# Patient Record
Sex: Female | Born: 2000 | Race: Black or African American | Hispanic: No | Marital: Single | Smoking: Never smoker
Health system: Southern US, Community
[De-identification: ages and names within clinical notes are randomized; demographics above are authoritative.]

## PROBLEM LIST (undated history)

## (undated) ENCOUNTER — Ambulatory Visit: Admission: EM | Source: Home / Self Care

## (undated) DIAGNOSIS — L509 Urticaria, unspecified: Secondary | ICD-10-CM

## (undated) DIAGNOSIS — T7840XA Allergy, unspecified, initial encounter: Secondary | ICD-10-CM

## (undated) DIAGNOSIS — IMO0002 Reserved for concepts with insufficient information to code with codable children: Secondary | ICD-10-CM

## (undated) DIAGNOSIS — D649 Anemia, unspecified: Secondary | ICD-10-CM

## (undated) DIAGNOSIS — L309 Dermatitis, unspecified: Secondary | ICD-10-CM

## (undated) DIAGNOSIS — T783XXA Angioneurotic edema, initial encounter: Secondary | ICD-10-CM

## (undated) DIAGNOSIS — B002 Herpesviral gingivostomatitis and pharyngotonsillitis: Secondary | ICD-10-CM

## (undated) DIAGNOSIS — R809 Proteinuria, unspecified: Secondary | ICD-10-CM

## (undated) DIAGNOSIS — S060X9A Concussion with loss of consciousness of unspecified duration, initial encounter: Secondary | ICD-10-CM

## (undated) HISTORY — DX: Concussion with loss of consciousness of unspecified duration, initial encounter: S06.0X9A

## (undated) HISTORY — DX: Herpesviral gingivostomatitis and pharyngotonsillitis: B00.2

## (undated) HISTORY — DX: Urticaria, unspecified: L50.9

## (undated) HISTORY — DX: Proteinuria, unspecified: R80.9

## (undated) HISTORY — DX: Allergy, unspecified, initial encounter: T78.40XA

## (undated) HISTORY — PX: NO PAST SURGERIES: SHX2092

## (undated) HISTORY — DX: Dermatitis, unspecified: L30.9

## (undated) HISTORY — DX: Angioneurotic edema, initial encounter: T78.3XXA

## (undated) HISTORY — DX: Anemia, unspecified: D64.9

## (undated) HISTORY — DX: Reserved for concepts with insufficient information to code with codable children: IMO0002

---
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
## 2019-01-08 ENCOUNTER — Ambulatory Visit: Payer: Medicaid Other | Admitting: Allergy and Immunology

## 2019-01-11 ENCOUNTER — Ambulatory Visit: Payer: Medicaid Other | Admitting: Family

## 2019-01-12 ENCOUNTER — Encounter: Payer: Self-pay | Admitting: Family Medicine

## 2019-01-12 ENCOUNTER — Ambulatory Visit (INDEPENDENT_AMBULATORY_CARE_PROVIDER_SITE_OTHER): Payer: Medicaid Other | Admitting: Family Medicine

## 2019-01-12 ENCOUNTER — Other Ambulatory Visit: Payer: Self-pay

## 2019-01-12 VITALS — BP 120/60 | HR 81 | Temp 98.0°F | Resp 16 | Ht 66.0 in | Wt 129.6 lb

## 2019-01-12 DIAGNOSIS — H1013 Acute atopic conjunctivitis, bilateral: Secondary | ICD-10-CM | POA: Diagnosis not present

## 2019-01-12 DIAGNOSIS — J3089 Other allergic rhinitis: Secondary | ICD-10-CM

## 2019-01-12 DIAGNOSIS — Z91018 Allergy to other foods: Secondary | ICD-10-CM | POA: Diagnosis not present

## 2019-01-12 NOTE — Progress Notes (Signed)
[address] [place] [address] Dept: [phone]  FOLLOW UP NOTE  Patient ID: Natasha Douglas, female    DOB: 2001-02-24  Age: 18 y.o. MRN: [id number] Date of Office Visit: 01/12/2019  Assessment  Chief Complaint: Food Intolerance (ate some shrimp, eyes started itching about 30 minutes later, woke up and eyes were swollen shut, ate purple and green grapes and lips swelled up) and Allergic Rhinitis  (has never happened to her with her dogs)  HPI Natasha Douglas is a 18 year old female who presents to the clinic for a follow up of an allergic reaction. She was last seen in this clinic on 07/20/2016 by Dr. Verlin Fester for evaluation of allergic reaction, allergic rhinitis, allergic conjunctivitis, and atopic dermatitis. At today's visit, she reports suspected allergic reactions occurring after eating shrimp and on a separate occasion eating grapes. She reports eye swelling that began after eating shrimp for which she took Benadryl with no relief of swelling. The swelling resolved in 2 days. Another incident occurred with blisters and burning of her lips immediately after eating grapes for which she used her triamcinolone cream with resolution if 2 days. She denies shortness of breath, however, she reports nausea during these events. She is currently avoiding seafood and grapes and has access to an epinephrine device at all times. Allergic rhinitis is reported as well controlled with Xyzal and Flonase as needed. Atopic dermatitis is well controlled with a daily moisturizer and triamcinolone cream as needed. Her current medications are listed in the chart.    Drug Allergies:  Allergies  Allergen Reactions  . Pollen Extract     Sneezing itchy eyes and runny nose  . Shrimp [Shellfish Allergy]     Facial swelling    Physical Exam: BP 120/60 (BP Location: Left Arm, Patient Position: Sitting, Cuff Size: Normal)   Pulse 81   Temp 98 F (36.7 C) (Temporal)   Resp 16   Ht 5\' 6"   (1.676 m)   Wt 129 lb 9.6 oz (58.8 kg)   SpO2 99%   BMI 20.92 kg/m    Physical Exam Vitals signs reviewed.  Constitutional:      Appearance: Normal appearance.  HENT:     Head: Normocephalic and atraumatic.     Right Ear: Tympanic membrane normal.     Left Ear: Tympanic membrane normal.     Nose:     Comments: Bilateral nares normal. Pharynx normal. Ears normal. Eyes normal    Mouth/Throat:     Pharynx: Oropharynx is clear.  Eyes:     Conjunctiva/sclera: Conjunctivae normal.  Neck:     Musculoskeletal: Normal range of motion and neck supple.  Cardiovascular:     Rate and Rhythm: Normal rate and regular rhythm.     Heart sounds: Normal heart sounds. No murmur.  Pulmonary:     Effort: Pulmonary effort is normal.     Breath sounds: Normal breath sounds.     Comments: Lungs clear to auscultation Musculoskeletal: Normal range of motion.  Skin:    General: Skin is warm and dry.  Neurological:     Mental Status: She is alert and oriented to person, place, and time.  Psychiatric:        Mood and Affect: Mood normal.        Behavior: Behavior normal.        Thought Content: Thought content normal.        Judgment: Judgment normal.     Diagnostics: Environmental skin testing  positive to grass pollen, tree pollen, and dust mites. Intradermal skint esting negative.  Selected food skin testing was negative.   Assessment and Plan: 1. Food allergy   2. Perennial and seasonal allergic rhinitis   3. Allergic conjunctivitis of both eyes     Patient Instructions  Food allergy Your skin testing to food was negative for all foods tested.  Continue to avoid shellfish, fish, grapes, and pineapple. In case of an allergic reaction, take Benadryl 50 mg every 4 hours, and if life-threatening symptoms occur, inject with EpiPen 0.3 mg. If your symptoms re-occur, begin a journal of events that occurred for up to 6 hours before your symptoms began including foods and beverages consumed,  soaps or perfumes you had contact with, and medications.   Allergic rhinitis Your environmental testing was positive to grass pollen, tree pollen, and dust mites. Avoidance measures provided. Continue with Claritin 10 mg once a day as needed for a runny nose Continue fluticasone nasal spray 2 sprays in each nostril once a day as needed for a stuffy nose Consider saline nasal rinses as needed for nasal symptoms. Use this before any medicated nasal sprays for best result  Atopic dermatitis Continue triamcinolone 0.1% cream twice a day as needed to red, itchy areas below your face. Continue a daily moisturizing routine  Call the clinic if this treatment plan is not working well for you  Follow up in 6 months or sooner if needed.  Return in about 6 months (around 07/12/2019), or if symptoms worsen or fail to improve.    Thank you for the opportunity to care for this patient.  Please do not hesitate to contact me with questions.  Thermon Leyland, FNP [facility]

## 2019-01-12 NOTE — Patient Instructions (Addendum)
Food allergy Your skin testing to food was negative for all foods tested.  Continue to avoid shellfish, fish, grapes, and pineapple. In case of an allergic reaction, take Benadryl 50 mg every 4 hours, and if life-threatening symptoms occur, inject with EpiPen 0.3 mg. If your symptoms re-occur, begin a journal of events that occurred for up to 6 hours before your symptoms began including foods and beverages consumed, soaps or perfumes you had contact with, and medications.   Allergic rhinitis Your environmental testing was positive to grass pollen, tree pollen, and dust mites. Avoidance measures provided. Continue with Claritin 10 mg once a day as needed for a runny nose Continue fluticasone nasal spray 2 sprays in each nostril once a day as needed for a stuffy nose Consider saline nasal rinses as needed for nasal symptoms. Use this before any medicated nasal sprays for best result  Atopic dermatitis Continue triamcinolone 0.1% cream twice a day as needed to red, itchy areas below your face. Continue a daily moisturizing routine  Call the clinic if this treatment plan is not working well for you  Follow up in 6 months or sooner if needed.  Reducing Pollen Exposure The American Academy of Allergy, Asthma and Immunology suggests the following steps to reduce your exposure to pollen during allergy seasons. 1. Do not hang sheets or clothing out to dry; pollen may collect on these items. 2. Do not mow lawns or spend time around freshly cut grass; mowing stirs up pollen. 3. Keep windows closed at night.  Keep car windows closed while driving. 4. Minimize morning activities outdoors, a time when pollen counts are usually at their highest. 5. Stay indoors as much as possible when pollen counts or humidity is high and on windy days when pollen tends to remain in the air longer. 6. Use air conditioning when possible.  Many air conditioners have filters that trap the pollen spores. 7. Use a HEPA room  air filter to remove pollen form the indoor air you breathe.  Control of House Dust Mite Allergen House dust mites play a major role in allergic asthma and rhinitis.  They occur in environments with high humidity wherever human skin, the food for dust mites is found. High levels have been detected in dust obtained from mattresses, pillows, carpets, upholstered furniture, bed covers, clothes and soft toys.  The principal allergen of the house dust mite is found in its feces.  A gram of dust may contain 1,000 mites and 250,000 fecal particles.  Mite antigen is easily measured in the air during house cleaning activities.    1. Encase mattresses, including the box spring, and pillow, in an air tight cover.  Seal the zipper end of the encased mattresses with wide adhesive tape. 2. Wash the bedding in water of 130 degrees Farenheit weekly.  Avoid cotton comforters/quilts and flannel bedding: the most ideal bed covering is the dacron comforter. 3. Remove all upholstered furniture from the bedroom. 4. Remove carpets, carpet padding, rugs, and non-washable window drapes from the bedroom.  Wash drapes weekly or use plastic window coverings. 5. Remove all non-washable stuffed toys from the bedroom.  Wash stuffed toys weekly. 6. Have the room cleaned frequently with a vacuum cleaner and a damp dust-mop.  The patient should not be in a room which is being cleaned and should wait 1 hour after cleaning before going into the room. 7. Close and seal all heating outlets in the bedroom.  Otherwise, the room will become filled with dust-laden  air.  An electric heater can be used to heat the room. 8. Reduce indoor humidity to less than 50%.  Do not use a humidifier.

## 2019-01-15 ENCOUNTER — Ambulatory Visit: Payer: Medicaid Other | Admitting: Pediatrics

## 2019-01-15 LAB — ALLERGEN PROFILE, FOOD-FISH
Allergen Mackerel IgE: <0.1 kU/L
Allergen Salmon IgE: <0.1 kU/L
Allergen Trout IgE: <0.1 kU/L
Allergen Walley Pike IgE: <0.1 kU/L
Codfish IgE: <0.1 kU/L
Halibut IgE: <0.1 kU/L
Tuna: <0.1 kU/L

## 2019-01-15 LAB — SACCHAROMYCES CEREVISIAE ANTIBODIES, IGG AND IGA
Saccharomyces cerevisiae, IgA: <20 Units (ref 0.0–24.9)
Saccharomyces cerevisiae, IgG: 59.7 Units — ABNORMAL HIGH (ref 0.0–24.9)

## 2019-01-15 LAB — F297-IGE ACACIA GUM: F297-IgE Acacia Gum: <0.1 kU/L

## 2019-01-15 LAB — ALLERGEN PROFILE, SHELLFISH
Clam IgE: <0.1 kU/L
F023-IgE Crab: <0.1 kU/L
F080-IgE Lobster: <0.1 kU/L
F290-IgE Oyster: <0.1 kU/L
Scallop IgE: <0.1 kU/L
Shrimp IgE: 0.11 kU/L — AB

## 2019-01-15 LAB — ALLERGEN GRAPE F259: Allergen Grape IgE: <0.1 kU/L

## 2019-01-15 LAB — ALLERGEN, PINEAPPLE, F210: Pineapple IgE: 0.33 kU/L — AB

## 2019-01-16 NOTE — Progress Notes (Signed)
Can you please let this patient know that her blood work was negative to grape and fish. She had slightly elevated level to shrimp and pineapple. At this time she should continue to avoid these foods. She con come into the clinic for a food challenge to these foods. Please have her keep a detailed journal of foods, medications, and products she has had beginning 6 hours before the reaction starts. Please offer patch testing also. Thank you

## 2019-01-29 ENCOUNTER — Encounter: Payer: Self-pay | Admitting: Family

## 2019-03-20 ENCOUNTER — Ambulatory Visit (INDEPENDENT_AMBULATORY_CARE_PROVIDER_SITE_OTHER): Payer: Medicaid Other | Admitting: Podiatry

## 2019-03-20 ENCOUNTER — Other Ambulatory Visit: Payer: Self-pay

## 2019-03-20 ENCOUNTER — Encounter: Payer: Self-pay | Admitting: Podiatry

## 2019-03-20 DIAGNOSIS — Z20828 Contact with and (suspected) exposure to other viral communicable diseases: Secondary | ICD-10-CM | POA: Diagnosis not present

## 2019-03-20 DIAGNOSIS — L600 Ingrowing nail: Secondary | ICD-10-CM | POA: Diagnosis not present

## 2019-03-20 DIAGNOSIS — G8929 Other chronic pain: Secondary | ICD-10-CM

## 2019-03-20 NOTE — Patient Instructions (Signed)

## 2019-03-21 ENCOUNTER — Encounter: Payer: Self-pay | Admitting: *Deleted

## 2019-03-21 ENCOUNTER — Telehealth: Payer: Self-pay | Admitting: Podiatry

## 2019-03-21 NOTE — Telephone Encounter (Signed)
Pt called to see if she could have a DR.note for work sent to her my chart or fax to her job at [phone] attn : evette nazario

## 2019-03-21 NOTE — Telephone Encounter (Signed)
Faxed out of work note to [employer].

## 2019-03-21 NOTE — Telephone Encounter (Signed)
Emailed out of work letter to pt.

## 2019-03-27 ENCOUNTER — Encounter: Payer: Self-pay | Admitting: Podiatry

## 2019-03-27 DIAGNOSIS — L600 Ingrowing nail: Secondary | ICD-10-CM | POA: Insufficient documentation

## 2019-03-27 NOTE — Progress Notes (Signed)
Subjective:   Patient ID: Nestor Ramp, female   DOB: 18 y.o.   MRN: [id number]   HPI 18 year old female presents the office today for concerns of ingrown toenails to both of her big toes with the left side worse than the right.  She states that both corners become tender in the left side however the lateral is more symptomatic.  She has noticed some swelling to the area.  Denies any significant drainage or pus or any redness or red streaks.  This been ongoing for last 1 year.  She has tried to trim the nails herself without any significant improvement.  She has no other concerns.   Review of Systems  All other systems reviewed and are negative.  Past Medical History:  Diagnosis Date  . Allergy   . Anemia    abstracted from outside medical records.   . Angio-edema   . Concussion 2012  . Displacement of intervertebral disc, site unspecified, without myelopathy    age 59 - abstracted from oustide medical record.    . Eczema   . Herpes gingivostomatitis    age 59 (abstracted from outside medical records)  . Proteinuria 2012   abstracted from outside medical record - random urine protein 19 mg/dL / Random cr 086.5  = normal PC ratio  . Urticaria     Past Surgical History:  Procedure Laterality Date  . NO PAST SURGERIES       Current Outpatient Medications:  .  EPINEPHrine 0.3 mg/0.3 mL IJ SOAJ injection, Inject 0.3 mLs (0.3 mg total) into the muscle as needed for anaphylaxis., Disp: 1 each, Rfl: 0 .  BENZACLIN gel, Apply topically every morning. (Patient not taking: Reported on 03/20/2019), Disp: 25 g, Rfl: 11 .  clindamycin-benzoyl peroxide (BENZACLIN) gel, Apply topically 2 (two) times daily. (Patient not taking: Reported on 03/20/2019), Disp: 25 g, Rfl: 0 .  famotidine (PEPCID) 20 MG tablet, Take 1 tablet (20 mg total) by mouth 2 (two) times daily. (Patient not taking: Reported on 03/20/2019), Disp: 30 tablet, Rfl: 0 .  loratadine (CLARITIN) 10 MG tablet, Take 1 tablet (10 mg  total) by mouth daily. (Patient not taking: Reported on 03/20/2019), Disp: 30 tablet, Rfl: 0  Allergies  Allergen Reactions  . Pollen Extract     Sneezing itchy eyes and runny nose  . Shrimp [Shellfish Allergy]     Facial swelling         Objective:  Physical Exam  General: AAO x3, NAD  Dermatological: Incurvation present to both the lateral > medial aspect the left hallux toenail with tenderness palpation there is localized edema but there is no significant erythema there is no ascending cellulitis.  There is no fluctuation crepitation.  There is no malodor.  Right side with no significant pain today.  Vascular: Dorsalis Pedis artery and Posterior Tibial artery pedal pulses are 2/4 bilateral with immedate capillary fill time. There is no pain with calf compression, swelling, warmth, erythema.   Neruologic: Grossly intact via light touch bilateral.Protective threshold with Semmes Wienstein monofilament intact to all pedal sites bilateral.   Musculoskeletal: No gross boney pedal deformities bilateral. No pain, crepitus, or limitation noted with foot and ankle range of motion bilateral. Muscular strength 5/5 in all groups tested bilateral.  Gait: Unassisted, Nonantalgic.       Assessment:   18 year old female with left symptomatic ingrown toenail lateral, medial nail borders     Plan:  -Treatment options discussed including all alternatives, risks, and complications -Etiology of  symptoms were discussed -At this time, the patient is requesting partial nail removal with chemical matricectomy to the symptomatic portion of the nail. Risks and complications were discussed with the patient for which they understand and written consent was obtained. Under sterile conditions a total of 3 mL of a mixture of 2% lidocaine plain and 0.5% Marcaine plain was infiltrated in a hallux block fashion. Once anesthetized, the skin was prepped in sterile fashion. A tourniquet was then applied. Next the  medial and lateral aspect of hallux nail border was then sharply excised making sure to remove the entire offending nail border. Once the nails were ensured to be removed area was debrided and the underlying skin was intact. There is no purulence identified in the procedure. Next phenol was then applied under standard conditions and copiously irrigated. Silvadene was applied. A dry sterile dressing was applied. After application of the dressing the tourniquet was removed and there is found to be an immediate capillary refill time to the digit. The patient tolerated the procedure well any complications. Post procedure instructions were discussed the patient for which he verbally understood. Follow-up in one week for nail check or sooner if any problems are to arise. Discussed signs/symptoms of infection and directed to call the office immediately should any occur or go directly to the emergency room. In the meantime, encouraged to call the office with any questions, concerns, changes symptoms.  Return in about 2 weeks (around 04/03/2019).  Trula Slade DPM

## 2019-04-03 ENCOUNTER — Ambulatory Visit: Payer: Medicaid Other | Admitting: Podiatry

## 2019-04-17 DIAGNOSIS — Z20828 Contact with and (suspected) exposure to other viral communicable diseases: Secondary | ICD-10-CM | POA: Diagnosis not present

## 2019-05-10 ENCOUNTER — Other Ambulatory Visit: Payer: Self-pay | Admitting: Family

## 2019-05-10 MED ORDER — BENZACLIN 1-5 % EX GEL: CUTANEOUS | 11 refills | Status: AC

## 2019-05-14 ENCOUNTER — Other Ambulatory Visit: Payer: Self-pay | Admitting: Pediatrics

## 2019-05-14 ENCOUNTER — Telehealth: Payer: Self-pay

## 2019-05-14 MED ORDER — TRETINOIN 0.025 % EX CREA: TOPICAL_CREAM | Freq: Every day | CUTANEOUS | 0 refills | Status: AC

## 2019-05-14 NOTE — Telephone Encounter (Signed)
Received notification from pharmacy- benzaclin gel no longer covered. Retin-A cream or gel preferred as of 04/20/19.

## 2019-05-14 NOTE — Telephone Encounter (Signed)
Sent retin-a cream

## 2019-05-26 DIAGNOSIS — H40033 Anatomical narrow angle, bilateral: Secondary | ICD-10-CM | POA: Diagnosis not present

## 2019-05-26 DIAGNOSIS — H1013 Acute atopic conjunctivitis, bilateral: Secondary | ICD-10-CM | POA: Diagnosis not present

## 2019-05-31 DIAGNOSIS — N7689 Other specified inflammation of vagina and vulva: Secondary | ICD-10-CM | POA: Diagnosis not present

## 2019-06-03 DIAGNOSIS — H5213 Myopia, bilateral: Secondary | ICD-10-CM | POA: Diagnosis not present

## 2019-07-10 DIAGNOSIS — Z20828 Contact with and (suspected) exposure to other viral communicable diseases: Secondary | ICD-10-CM | POA: Diagnosis not present

## 2019-08-09 DIAGNOSIS — J302 Other seasonal allergic rhinitis: Secondary | ICD-10-CM | POA: Diagnosis not present

## 2019-08-09 DIAGNOSIS — Z1329 Encounter for screening for other suspected endocrine disorder: Secondary | ICD-10-CM | POA: Diagnosis not present

## 2019-08-09 DIAGNOSIS — Z13220 Encounter for screening for lipoid disorders: Secondary | ICD-10-CM | POA: Diagnosis not present

## 2019-08-09 DIAGNOSIS — Z131 Encounter for screening for diabetes mellitus: Secondary | ICD-10-CM | POA: Diagnosis not present

## 2019-08-09 DIAGNOSIS — Z113 Encounter for screening for infections with a predominantly sexual mode of transmission: Secondary | ICD-10-CM | POA: Diagnosis not present

## 2019-08-13 ENCOUNTER — Encounter: Payer: Self-pay | Admitting: *Deleted

## 2019-08-17 DIAGNOSIS — E782 Mixed hyperlipidemia: Secondary | ICD-10-CM | POA: Diagnosis not present

## 2019-08-17 DIAGNOSIS — J302 Other seasonal allergic rhinitis: Secondary | ICD-10-CM | POA: Diagnosis not present

## 2019-08-17 DIAGNOSIS — Z3009 Encounter for other general counseling and advice on contraception: Secondary | ICD-10-CM | POA: Diagnosis not present

## 2019-08-17 DIAGNOSIS — A749 Chlamydial infection, unspecified: Secondary | ICD-10-CM | POA: Diagnosis not present

## 2019-08-17 DIAGNOSIS — D72819 Decreased white blood cell count, unspecified: Secondary | ICD-10-CM | POA: Diagnosis not present

## 2019-08-17 DIAGNOSIS — A549 Gonococcal infection, unspecified: Secondary | ICD-10-CM | POA: Diagnosis not present

## 2019-08-24 DIAGNOSIS — E782 Mixed hyperlipidemia: Secondary | ICD-10-CM | POA: Diagnosis not present

## 2019-08-24 DIAGNOSIS — D72819 Decreased white blood cell count, unspecified: Secondary | ICD-10-CM | POA: Diagnosis not present

## 2019-08-24 DIAGNOSIS — E611 Iron deficiency: Secondary | ICD-10-CM | POA: Diagnosis not present

## 2019-08-24 DIAGNOSIS — J302 Other seasonal allergic rhinitis: Secondary | ICD-10-CM | POA: Diagnosis not present

## 2019-08-24 DIAGNOSIS — A549 Gonococcal infection, unspecified: Secondary | ICD-10-CM | POA: Diagnosis not present

## 2019-08-24 DIAGNOSIS — A749 Chlamydial infection, unspecified: Secondary | ICD-10-CM | POA: Diagnosis not present

## 2019-08-24 DIAGNOSIS — Z3009 Encounter for other general counseling and advice on contraception: Secondary | ICD-10-CM | POA: Diagnosis not present

## 2019-08-29 ENCOUNTER — Telehealth: Payer: Self-pay | Admitting: Hematology and Oncology

## 2019-08-29 NOTE — Telephone Encounter (Signed)
Received a new hem referral from [facility] for leukopenia. Natasha Douglas has been cld and scheduled to see Dr. Leonides Schanz on 5/19 at 1pm. Pt aware to arrive 15 minutes early.

## 2019-09-02 DIAGNOSIS — Z20828 Contact with and (suspected) exposure to other viral communicable diseases: Secondary | ICD-10-CM | POA: Diagnosis not present

## 2019-09-04 NOTE — Progress Notes (Deleted)
[place] [phone]   Fax:([phone] [place] NOTE  Patient Care Team: Rae Lips, MD as PCP - General (Pediatrics)  Hematological/Oncological History #Leukopenia, Neutropenia 1)   CHIEF COMPLAINTS/PURPOSE OF CONSULTATION:  "*** "  HISTORY OF PRESENTING ILLNESS:  Natasha Douglas 19 y.o. female with medical history significant for ***  On review of the previous records ***  On exam today ***  MEDICAL HISTORY:  Past Medical History:  Diagnosis Date  . Allergy   . Anemia    abstracted from outside medical records.   . Angio-edema   . Concussion 2012  . Displacement of intervertebral disc, site unspecified, without myelopathy    age 55 - abstracted from oustide medical record.    . Eczema   . Herpes gingivostomatitis    age 76 (abstracted from outside medical records)  . Proteinuria 2012   abstracted from outside medical record - random urine protein 19 mg/dL / Random cr 166.9  = normal PC ratio  . Urticaria     SURGICAL HISTORY: Past Surgical History:  Procedure Laterality Date  . NO PAST SURGERIES      SOCIAL HISTORY: Social History   Socioeconomic History  . Marital status: Single    Spouse name: Not on file  . Number of children: Not on file  . Years of education: Not on file  . Highest education level: Not on file  Occupational History  . Not on file  Tobacco Use  . Smoking status: Never Smoker  . Smokeless tobacco: Never Used  Substance and Sexual Activity  . Alcohol use: No  . Drug use: No  . Sexual activity: Not on file  Other Topics Concern  . Not on file  Social History Narrative   ** Merged History Encounter **       Social Determinants of Health   Financial Resource Strain:   . Difficulty of Paying Living Expenses:   Food Insecurity:   . Worried About Charity fundraiser in the Last Year:   . Arboriculturist in the Last Year:   Transportation Needs:   . Film/video editor  (Medical):   Marland Kitchen Lack of Transportation (Non-Medical):   Physical Activity:   . Days of Exercise per Week:   . Minutes of Exercise per Session:   Stress:   . Feeling of Stress :   Social Connections:   . Frequency of Communication with Friends and Family:   . Frequency of Social Gatherings with Friends and Family:   . Attends Religious Services:   . Active Member of Clubs or Organizations:   . Attends Archivist Meetings:   Marland Kitchen Marital Status:   Intimate Partner Violence:   . Fear of Current or Ex-Partner:   . Emotionally Abused:   Marland Kitchen Physically Abused:   . Sexually Abused:     FAMILY HISTORY: Family History  Problem Relation Age of Onset  . Allergic rhinitis Mother     ALLERGIES:  is allergic to pollen extract and shrimp [shellfish allergy].  MEDICATIONS:  Current Outpatient Medications  Medication Sig Dispense Refill  . BENZACLIN gel Apply topically every morning. 25 g 11  . EPINEPHrine 0.3 mg/0.3 mL IJ SOAJ injection Inject 0.3 mLs (0.3 mg total) into the muscle as needed for anaphylaxis. 1 each 0  . famotidine (PEPCID) 20 MG tablet Take 1 tablet (20 mg total) by mouth 2 (two) times daily. (Patient not taking: Reported on 03/20/2019) 30 tablet 0  .  loratadine (CLARITIN) 10 MG tablet Take 1 tablet (10 mg total) by mouth daily. (Patient not taking: Reported on 03/20/2019) 30 tablet 0  . tretinoin (RETIN-A) 0.025 % cream Apply topically at bedtime. 45 g 0   No current facility-administered medications for this visit.    REVIEW OF SYSTEMS:   Constitutional: ( - ) fevers, ( - )  chills , ( - ) night sweats Eyes: ( - ) blurriness of vision, ( - ) double vision, ( - ) watery eyes Ears, nose, mouth, throat, and face: ( - ) mucositis, ( - ) sore throat Respiratory: ( - ) cough, ( - ) dyspnea, ( - ) wheezes Cardiovascular: ( - ) palpitation, ( - ) chest discomfort, ( - ) lower extremity swelling Gastrointestinal:  ( - ) nausea, ( - ) heartburn, ( - ) change in bowel  habits Skin: ( - ) abnormal skin rashes Lymphatics: ( - ) new lymphadenopathy, ( - ) easy bruising Neurological: ( - ) numbness, ( - ) tingling, ( - ) new weaknesses Behavioral/Psych: ( - ) mood change, ( - ) new changes  All other systems were reviewed with the patient and are negative.  PHYSICAL EXAMINATION: ECOG PERFORMANCE STATUS: {CHL ONC ECOG [phone]}  There were no vitals filed for this visit. There were no vitals filed for this visit.  GENERAL: well appearing *** in NAD  SKIN: skin color, texture, turgor are normal, no rashes or significant lesions EYES: conjunctiva are pink and non-injected, sclera clear OROPHARYNX: no exudate, no erythema; lips, buccal mucosa, and tongue normal  NECK: supple, non-tender LYMPH:  no palpable lymphadenopathy in the cervical, axillary or inguinal LUNGS: clear to auscultation and percussion with normal breathing effort HEART: regular rate & rhythm and no murmurs and no lower extremity edema ABDOMEN: soft, non-tender, non-distended, normal bowel sounds Musculoskeletal: no cyanosis of digits and no clubbing  PSYCH: alert & oriented x 3, fluent speech NEURO: no focal motor/sensory deficits  LABORATORY DATA:  I have reviewed the data as listed CBC Latest Ref Rng & Units 12/14/2017 04/29/2017  Hemoglobin 12.2 - 16.2 g/dL 98.3 10.1(A)    No flowsheet data found.   PATHOLOGY: ***  BLOOD FILM: *** Review of the peripheral blood smear showed normal appearing white cells with neutrophils that were appropriately lobated and granulated. There was no predominance of bi-lobed or hyper-segmented neutrophils appreciated. No Dohle bodies were noted. There was no left shifting, immature forms or blasts noted. Lymphocytes remain normal in size without any predominance of large granular lymphocytes. Red cells show no anisopoikilocytosis, macrocytes , microcytes or polychromasia. There were no schistocytes, target cells, echinocytes, acanthocytes,  dacrocytes, or stomatocytes.There was no rouleaux formation, nucleated red cells, or intra-cellular inclusions noted. The platelets are normal in size, shape, and color without any clumping evident.  RADIOGRAPHIC STUDIES: I have personally reviewed the radiological images as listed and agreed with the findings in the report. No results found.  ASSESSMENT & PLAN ***  No orders of the defined types were placed in this encounter.   All questions were answered. The patient knows to call the clinic with any problems, questions or concerns.  A total of more than {CHL ONC TIME VISIT - JASNK:[id number]} were spent on this encounter and over half of that time was spent on counseling and coordination of care as outlined above.   Ulysees Barns, MD Department of Hematology/Oncology [facility] Phone: [phone] Pager: [phone] Email: [email or web address]  09/04/2019 9:39  PM

## 2019-09-05 ENCOUNTER — Inpatient Hospital Stay: Payer: Medicaid Other

## 2019-09-05 ENCOUNTER — Inpatient Hospital Stay: Payer: Medicaid Other | Attending: Hematology and Oncology | Admitting: Hematology and Oncology

## 2019-09-06 ENCOUNTER — Encounter: Payer: Self-pay | Admitting: Pediatrics

## 2019-09-27 ENCOUNTER — Encounter: Payer: Medicaid Other | Admitting: Obstetrics and Gynecology

## 2019-10-14 DIAGNOSIS — Z20828 Contact with and (suspected) exposure to other viral communicable diseases: Secondary | ICD-10-CM | POA: Diagnosis not present

## 2019-11-25 DIAGNOSIS — Z1152 Encounter for screening for COVID-19: Secondary | ICD-10-CM | POA: Diagnosis not present

## 2020-02-05 DIAGNOSIS — R5381 Other malaise: Secondary | ICD-10-CM | POA: Diagnosis not present

## 2020-02-05 DIAGNOSIS — Z20822 Contact with and (suspected) exposure to covid-19: Secondary | ICD-10-CM | POA: Diagnosis not present

## 2020-02-12 DIAGNOSIS — R300 Dysuria: Secondary | ICD-10-CM | POA: Diagnosis not present

## 2020-02-12 DIAGNOSIS — N7689 Other specified inflammation of vagina and vulva: Secondary | ICD-10-CM | POA: Diagnosis not present

## 2020-03-27 DIAGNOSIS — M549 Dorsalgia, unspecified: Secondary | ICD-10-CM | POA: Diagnosis not present

## 2020-04-14 ENCOUNTER — Other Ambulatory Visit: Payer: Self-pay | Admitting: Physician Assistant

## 2020-04-14 DIAGNOSIS — N644 Mastodynia: Secondary | ICD-10-CM

## 2020-04-14 DIAGNOSIS — N6310 Unspecified lump in the right breast, unspecified quadrant: Secondary | ICD-10-CM

## 2020-05-01 DIAGNOSIS — N944 Primary dysmenorrhea: Secondary | ICD-10-CM | POA: Diagnosis not present

## 2020-05-01 DIAGNOSIS — J302 Other seasonal allergic rhinitis: Secondary | ICD-10-CM | POA: Diagnosis not present

## 2020-05-01 DIAGNOSIS — Z113 Encounter for screening for infections with a predominantly sexual mode of transmission: Secondary | ICD-10-CM | POA: Diagnosis not present

## 2020-05-01 DIAGNOSIS — D72819 Decreased white blood cell count, unspecified: Secondary | ICD-10-CM | POA: Diagnosis not present

## 2020-05-01 DIAGNOSIS — E782 Mixed hyperlipidemia: Secondary | ICD-10-CM | POA: Diagnosis not present

## 2020-05-01 DIAGNOSIS — E611 Iron deficiency: Secondary | ICD-10-CM | POA: Diagnosis not present

## 2020-05-01 DIAGNOSIS — Z3009 Encounter for other general counseling and advice on contraception: Secondary | ICD-10-CM | POA: Diagnosis not present

## 2020-05-21 DIAGNOSIS — E611 Iron deficiency: Secondary | ICD-10-CM | POA: Diagnosis not present

## 2020-05-21 DIAGNOSIS — J302 Other seasonal allergic rhinitis: Secondary | ICD-10-CM | POA: Diagnosis not present

## 2020-05-21 DIAGNOSIS — D72819 Decreased white blood cell count, unspecified: Secondary | ICD-10-CM | POA: Diagnosis not present

## 2020-05-21 DIAGNOSIS — N944 Primary dysmenorrhea: Secondary | ICD-10-CM | POA: Diagnosis not present

## 2020-05-21 DIAGNOSIS — R768 Other specified abnormal immunological findings in serum: Secondary | ICD-10-CM | POA: Diagnosis not present

## 2020-05-21 DIAGNOSIS — E782 Mixed hyperlipidemia: Secondary | ICD-10-CM | POA: Diagnosis not present

## 2020-06-11 DIAGNOSIS — Z1152 Encounter for screening for COVID-19: Secondary | ICD-10-CM | POA: Diagnosis not present

## 2020-06-18 DIAGNOSIS — N7689 Other specified inflammation of vagina and vulva: Secondary | ICD-10-CM | POA: Diagnosis not present

## 2020-06-25 DIAGNOSIS — N898 Other specified noninflammatory disorders of vagina: Secondary | ICD-10-CM | POA: Diagnosis not present

## 2020-06-25 DIAGNOSIS — N644 Mastodynia: Secondary | ICD-10-CM | POA: Diagnosis not present

## 2020-06-25 DIAGNOSIS — N6011 Diffuse cystic mastopathy of right breast: Secondary | ICD-10-CM | POA: Diagnosis not present

## 2020-06-25 DIAGNOSIS — N6012 Diffuse cystic mastopathy of left breast: Secondary | ICD-10-CM | POA: Diagnosis not present

## 2020-06-25 DIAGNOSIS — Z0000 Encounter for general adult medical examination without abnormal findings: Secondary | ICD-10-CM | POA: Diagnosis not present

## 2020-07-25 IMAGING — US ULTRASOUND LEFT BREAST LIMITED
1 series · 4 of 4 positions shown · non-contrast
Comparison: None.

CLINICAL DATA: 17-year-old female presenting for evaluation of
chronic intermittent bilateral diffuse breast pain which corresponds
with her menstrual cycle. More recently, she has had increasing pain
in her breasts after she has stopped using Depo-Provera.

EXAM:
ULTRASOUND OF THE BILATERAL BREAST

[Series 1: ultrasound left breast limited · 0.06mm/px · 4 of 4 slices shown]
[im 1/4]
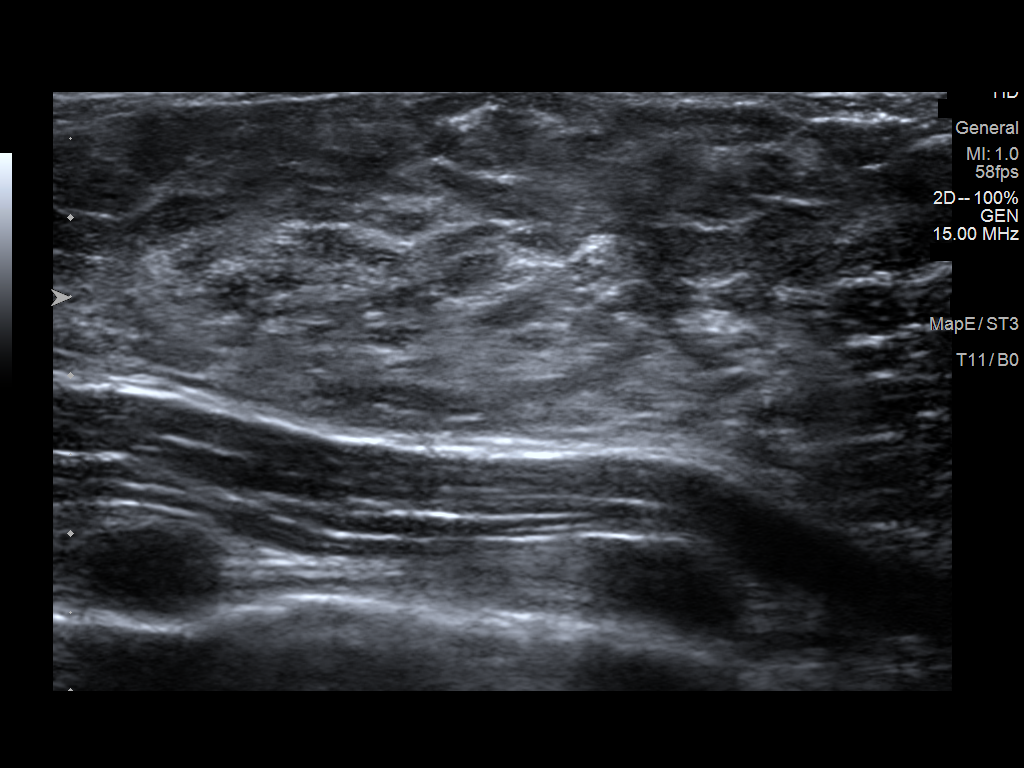
[im 2/4]
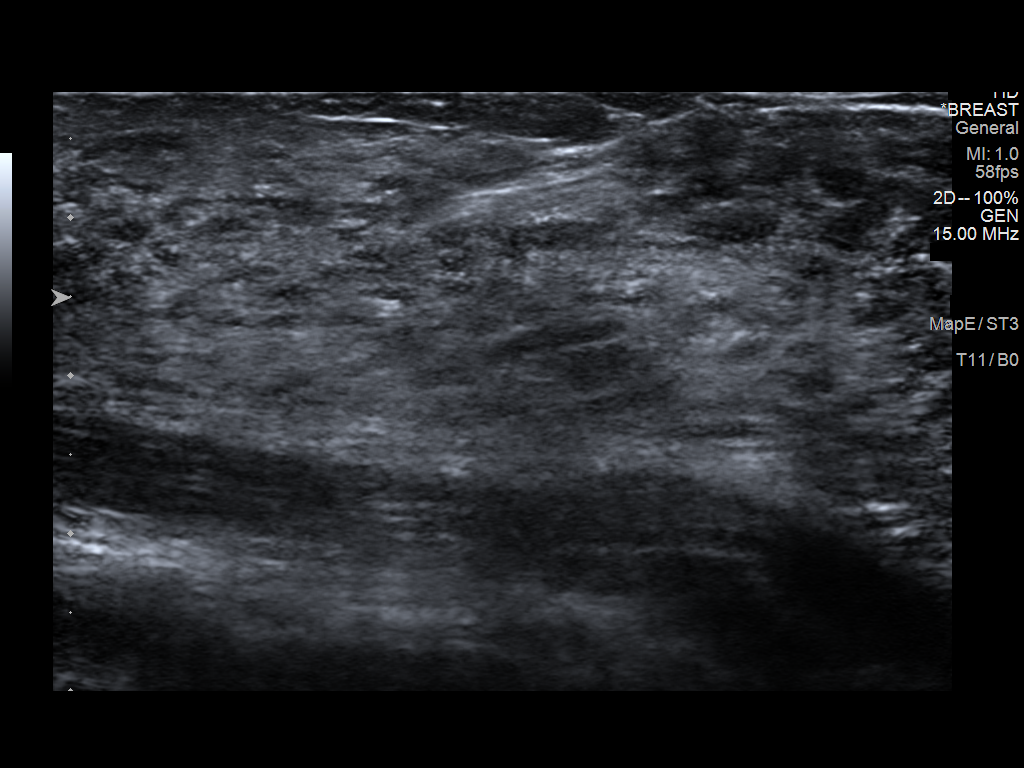
[im 3/4]
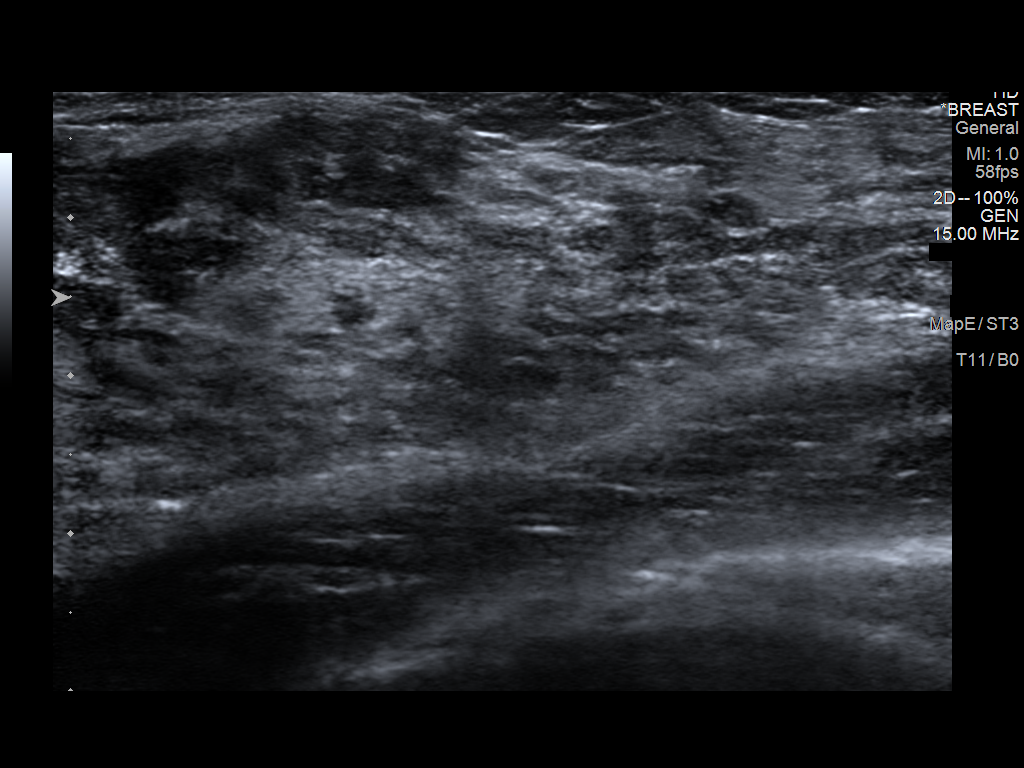
[im 4/4]
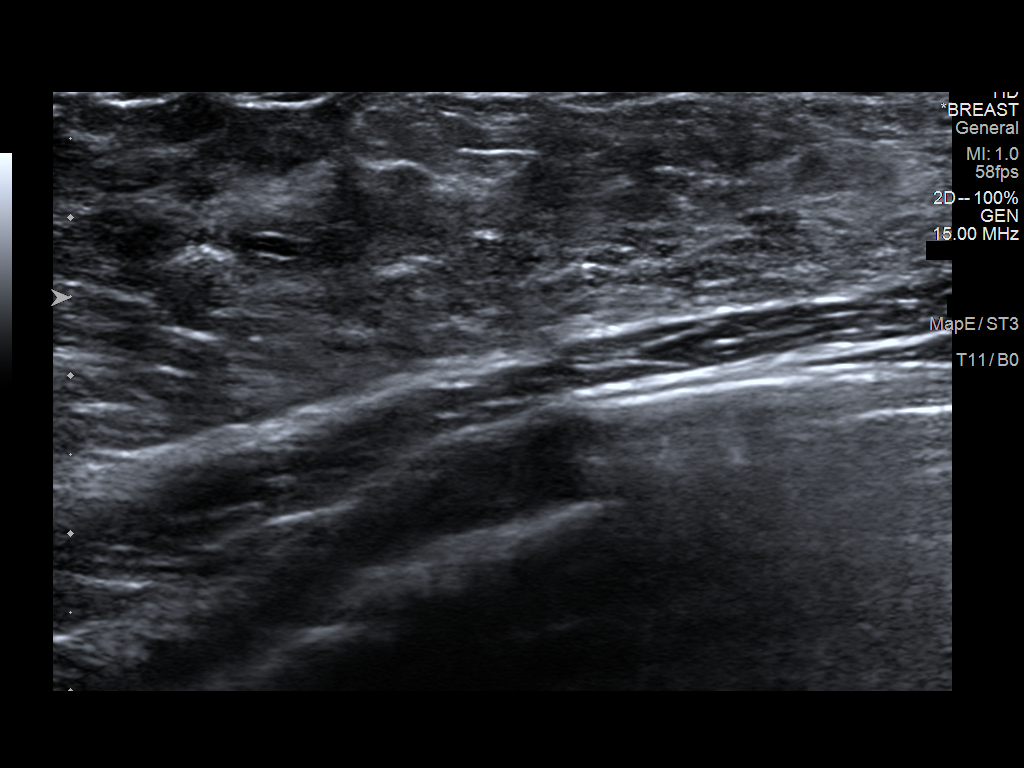

[4 of 4 positions shown; findings below may reference images not displayed]

FINDINGS: Ultrasound of the upper-outer quadrants of the breasts bilaterally,
described as the areas of most pain by the patient, demonstrates
normal fibroglandular tissue. No suspicious masses, areas of
shadowing or significant fibrocystic change is identified.
IMPRESSION: 1. There are no suspicious targeted sonographic findings in the
upper-outer breasts bilaterally to explain the patient's breast
pain.

RECOMMENDATION:
1. Clinical follow-up recommended for the patient's breast pain. By
description, this type of pain is classically benign, and related to
cyclic hormonal changes. Any further workup should be based on
clinical grounds.

2. Screening mammogram at age 39 unless there are persistent or
intervening clinical concerns. (Code:[id number])

I have discussed the findings and recommendations with the patient.
Results were also provided in writing at the conclusion of the
visit. If applicable, a reminder letter will be sent to the patient
regarding the next appointment.

BI-RADS CATEGORY  1: Negative.

## 2021-02-23 ENCOUNTER — Ambulatory Visit
Admission: EM | Admit: 2021-02-23 | Discharge: 2021-02-23 | Disposition: A | Discharge status: Home / Self Care | Payer: Medicaid Other | Attending: Emergency Medicine | Admitting: Emergency Medicine

## 2021-02-23 ENCOUNTER — Emergency Department (HOSPITAL_BASED_OUTPATIENT_CLINIC_OR_DEPARTMENT_OTHER)
Admission: EM | Admit: 2021-02-23 | Discharge: 2021-02-23 | Disposition: A | Discharge status: Home / Self Care | Payer: Medicaid Other | Attending: Emergency Medicine | Admitting: Emergency Medicine

## 2021-02-23 ENCOUNTER — Encounter (HOSPITAL_BASED_OUTPATIENT_CLINIC_OR_DEPARTMENT_OTHER): Payer: Self-pay

## 2021-02-23 ENCOUNTER — Other Ambulatory Visit: Payer: Self-pay

## 2021-02-23 ENCOUNTER — Encounter: Payer: Self-pay | Admitting: Emergency Medicine

## 2021-02-23 DIAGNOSIS — S51812A Laceration without foreign body of left forearm, initial encounter: Secondary | ICD-10-CM | POA: Diagnosis present

## 2021-02-23 DIAGNOSIS — Z23 Encounter for immunization: Secondary | ICD-10-CM | POA: Insufficient documentation

## 2021-02-23 DIAGNOSIS — W272XXA Contact with scissors, initial encounter: Secondary | ICD-10-CM | POA: Insufficient documentation

## 2021-02-23 DIAGNOSIS — S41112A Laceration without foreign body of left upper arm, initial encounter: Secondary | ICD-10-CM | POA: Diagnosis not present

## 2021-02-23 MED ORDER — TETANUS-DIPHTH-ACELL PERTUSSIS 5-2.5-18.5 LF-MCG/0.5 IM SUSY
0.5000 mL | PREFILLED_SYRINGE | Freq: Once | INTRAMUSCULAR | Status: AC
  Administered 2021-02-23: 0.5 mL via INTRAMUSCULAR
  Filled 2021-02-23: qty 0.5

## 2021-02-23 MED ORDER — LIDOCAINE HCL (PF) 1 % IJ SOLN
10.0000 mL | Freq: Once | INTRAMUSCULAR | Status: AC
  Administered 2021-02-23: 10 mL
  Filled 2021-02-23: qty 10

## 2021-02-23 MED ORDER — IBUPROFEN 800 MG PO TABS: 800.0000 mg | ORAL_TABLET | Freq: Three times a day (TID) | ORAL | 0 refills | Status: DC

## 2021-02-23 MED ORDER — LIDOCAINE-EPINEPHRINE-TETRACAINE (LET) TOPICAL GEL
3.0000 mL | Freq: Once | TOPICAL | Status: AC
  Administered 2021-02-23: 3 mL via TOPICAL
  Filled 2021-02-23: qty 3

## 2021-02-23 MED ORDER — IBUPROFEN 800 MG PO TABS
800.0000 mg | ORAL_TABLET | Freq: Once | ORAL | Status: AC
  Administered 2021-02-23: 800 mg via ORAL
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
## 2021-02-23 NOTE — Discharge Instructions (Addendum)
Please return to the ED or any urgent care in 7 days for suture removal.  Attached are additional instructions on laceration care.   Return sooner for any signs of infection including redness/swelling around the wound, drainage of pus, fevers greater than 100.4, red streaking up arm.   You can take ibuprofen as needed for pain.  You can apply ice to the laceration to help with inflammation.  Do not apply anything topical on the sutures.
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
## 2021-02-23 NOTE — ED Triage Notes (Signed)
Patient presents to [facility] for evaluation of laceration to left forearm/AC.  Possible last tetanus in 2019.  Bleeding controlled by EMs on scene and sent here for further evaluation.  Some drainage to bandage, but bleeding controlled at this time.
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
## 2021-02-23 NOTE — ED Notes (Signed)
ED Provider at bedside. 

## 2021-02-23 NOTE — ED Notes (Signed)
Dry dressing applied

## 2021-02-23 NOTE — Discharge Instructions (Addendum)
You have a laceration of your left forearm deep to the fascia which is now exposing your basilic vein.  I am very sorry that you are not able to provide you with a suture here at urgent care due to the risk that I may make that vein causing a significant amount of bleeding that I will not be able to control here in this office.  Therefore I recommend that you go to one of our outlying emergency room facilities, either at [facility], [address] or at [facility], [address].  As we discussed, there will be a note in your chart regarding your visit here today so that they will know why you are sent to the emergency room.  You are welcome to mention this when you check-in.

## 2021-02-24 DIAGNOSIS — E611 Iron deficiency: Secondary | ICD-10-CM | POA: Diagnosis not present

## 2021-02-24 DIAGNOSIS — N3000 Acute cystitis without hematuria: Secondary | ICD-10-CM | POA: Diagnosis not present

## 2021-02-24 DIAGNOSIS — J302 Other seasonal allergic rhinitis: Secondary | ICD-10-CM | POA: Diagnosis not present

## 2021-02-24 DIAGNOSIS — E782 Mixed hyperlipidemia: Secondary | ICD-10-CM | POA: Diagnosis not present

## 2021-02-24 DIAGNOSIS — Z131 Encounter for screening for diabetes mellitus: Secondary | ICD-10-CM | POA: Diagnosis not present

## 2021-02-24 DIAGNOSIS — Z0000 Encounter for general adult medical examination without abnormal findings: Secondary | ICD-10-CM | POA: Diagnosis not present

## 2021-02-24 DIAGNOSIS — N944 Primary dysmenorrhea: Secondary | ICD-10-CM | POA: Diagnosis not present

## 2021-03-01 DIAGNOSIS — Z4802 Encounter for removal of sutures: Secondary | ICD-10-CM | POA: Diagnosis not present

## 2021-03-06 DIAGNOSIS — Z4802 Encounter for removal of sutures: Secondary | ICD-10-CM | POA: Diagnosis not present

## 2021-04-17 DIAGNOSIS — R519 Headache, unspecified: Secondary | ICD-10-CM | POA: Diagnosis not present

## 2021-04-17 DIAGNOSIS — R112 Nausea with vomiting, unspecified: Secondary | ICD-10-CM | POA: Diagnosis not present

## 2021-04-17 DIAGNOSIS — K529 Noninfective gastroenteritis and colitis, unspecified: Secondary | ICD-10-CM | POA: Diagnosis not present

## 2021-04-23 DIAGNOSIS — S0033XA Contusion of nose, initial encounter: Secondary | ICD-10-CM | POA: Diagnosis not present

## 2021-08-11 DIAGNOSIS — Z0000 Encounter for general adult medical examination without abnormal findings: Secondary | ICD-10-CM | POA: Diagnosis not present

## 2021-08-11 DIAGNOSIS — Z131 Encounter for screening for diabetes mellitus: Secondary | ICD-10-CM | POA: Diagnosis not present

## 2021-08-11 DIAGNOSIS — L308 Other specified dermatitis: Secondary | ICD-10-CM | POA: Diagnosis not present

## 2021-08-11 DIAGNOSIS — E782 Mixed hyperlipidemia: Secondary | ICD-10-CM | POA: Diagnosis not present

## 2021-08-11 DIAGNOSIS — N944 Primary dysmenorrhea: Secondary | ICD-10-CM | POA: Diagnosis not present

## 2021-08-11 DIAGNOSIS — E611 Iron deficiency: Secondary | ICD-10-CM | POA: Diagnosis not present

## 2021-08-11 DIAGNOSIS — J302 Other seasonal allergic rhinitis: Secondary | ICD-10-CM | POA: Diagnosis not present

## 2021-08-18 DIAGNOSIS — N760 Acute vaginitis: Secondary | ICD-10-CM | POA: Diagnosis not present

## 2021-08-18 DIAGNOSIS — N644 Mastodynia: Secondary | ICD-10-CM | POA: Diagnosis not present

## 2021-08-18 DIAGNOSIS — Z113 Encounter for screening for infections with a predominantly sexual mode of transmission: Secondary | ICD-10-CM | POA: Diagnosis not present

## 2021-08-18 DIAGNOSIS — B9689 Other specified bacterial agents as the cause of diseases classified elsewhere: Secondary | ICD-10-CM | POA: Diagnosis not present

## 2021-08-18 DIAGNOSIS — Z0001 Encounter for general adult medical examination with abnormal findings: Secondary | ICD-10-CM | POA: Diagnosis not present

## 2021-08-25 DIAGNOSIS — N644 Mastodynia: Secondary | ICD-10-CM | POA: Diagnosis not present

## 2021-09-13 ENCOUNTER — Ambulatory Visit: Payer: Medicaid Other

## 2022-02-04 ENCOUNTER — Ambulatory Visit: Payer: Self-pay

## 2022-04-30 ENCOUNTER — Ambulatory Visit: Payer: Self-pay

## 2022-08-19 ENCOUNTER — Ambulatory Visit: Admit: 2022-08-19 | Discharge status: Against Medical Advice | Payer: Medicaid Other

## 2023-02-16 ENCOUNTER — Encounter (HOSPITAL_COMMUNITY): Payer: Self-pay

## 2023-02-16 ENCOUNTER — Emergency Department (HOSPITAL_COMMUNITY)
Admission: EM | Admit: 2023-02-16 | Discharge: 2023-02-16 | Disposition: A | Discharge status: Home / Self Care | Payer: Medicaid Other | Attending: Emergency Medicine | Admitting: Emergency Medicine

## 2023-02-16 ENCOUNTER — Other Ambulatory Visit: Payer: Self-pay

## 2023-02-16 ENCOUNTER — Emergency Department (HOSPITAL_COMMUNITY): Payer: Medicaid Other

## 2023-02-16 DIAGNOSIS — S0990XA Unspecified injury of head, initial encounter: Secondary | ICD-10-CM | POA: Diagnosis present

## 2023-02-16 DIAGNOSIS — W01198A Fall on same level from slipping, tripping and stumbling with subsequent striking against other object, initial encounter: Secondary | ICD-10-CM | POA: Diagnosis not present

## 2023-02-16 DIAGNOSIS — R55 Syncope and collapse: Secondary | ICD-10-CM | POA: Insufficient documentation

## 2023-02-16 DIAGNOSIS — W19XXXA Unspecified fall, initial encounter: Secondary | ICD-10-CM

## 2023-02-16 LAB — BASIC METABOLIC PANEL
Anion gap: 10 (ref 5–15)
BUN: 11 mg/dL (ref 6–20)
CO2: 23 mmol/L (ref 22–32)
Calcium: 9.1 mg/dL (ref 8.9–10.3)
Chloride: 103 mmol/L (ref 98–111)
Creatinine, Ser: 0.85 mg/dL (ref 0.44–1.00)
GFR, Estimated: >60 mL/min (ref >60)
Glucose, Bld: 88 mg/dL (ref 70–99)
Potassium: 3.7 mmol/L (ref 3.5–5.1)
Sodium: 136 mmol/L (ref 135–145)

## 2023-02-16 LAB — CBC
HCT: 36.7 % (ref 36.0–46.0)
Hemoglobin: 12.4 g/dL (ref 12.0–15.0)
MCH: 29.6 pg (ref 26.0–34.0)
MCHC: 33.8 g/dL (ref 30.0–36.0)
MCV: 87.6 fL (ref 80.0–100.0)
Platelets: 179 10*3/uL (ref 150–400)
RBC: 4.19 MIL/uL (ref 3.87–5.11)
RDW: 13.1 % (ref 11.5–15.5)
WBC: 3.5 10*3/uL — ABNORMAL LOW (ref 4.0–10.5)
nRBC: 0 % (ref 0.0–0.2)

## 2023-02-16 LAB — URINALYSIS, ROUTINE W REFLEX MICROSCOPIC
Bilirubin Urine: NEGATIVE
Glucose, UA: NEGATIVE mg/dL
Hgb urine dipstick: NEGATIVE
Ketones, ur: 20 mg/dL — AB
Leukocytes,Ua: NEGATIVE
Nitrite: NEGATIVE
Protein, ur: NEGATIVE mg/dL
Specific Gravity, Urine: 1.015 (ref 1.005–1.030)
pH: 5 (ref 5.0–8.0)

## 2023-02-16 LAB — CBG MONITORING, ED: Glucose-Capillary: 87 mg/dL (ref 70–99)

## 2023-02-16 LAB — HCG, SERUM, QUALITATIVE: Preg, Serum: NEGATIVE

## 2023-02-16 MED ORDER — ACETAMINOPHEN 500 MG PO TABS
1000.0000 mg | ORAL_TABLET | Freq: Once | ORAL | Status: AC
  Administered 2023-02-16: 1000 mg via ORAL
  Filled 2023-02-16: qty 2

## 2023-02-16 MED ORDER — NAPROXEN 500 MG PO TABS
500.0000 mg | ORAL_TABLET | Freq: Once | ORAL | Status: AC
  Administered 2023-02-16: 500 mg via ORAL
  Filled 2023-02-16: qty 1

## 2023-02-16 MED ORDER — LIDOCAINE 5 % EX PTCH
1.0000 | MEDICATED_PATCH | CUTANEOUS | Status: DC
  Administered 2023-02-16: 1 via TRANSDERMAL
  Filled 2023-02-16: qty 1

## 2023-02-16 NOTE — ED Triage Notes (Signed)
Pt in c-collar on arrival

## 2023-02-16 NOTE — ED Provider Notes (Signed)
[place] EMERGENCY DEPARTMENT AT [facility] Provider Note   CSN: [id number] Arrival date & time: 02/16/23  0028     History  Chief Complaint  Patient presents with   Natasha Douglas Natasha Douglas is a 22 y.o. female.  The history is provided by the patient and a parent.  Fall This is a new problem. The current episode started 6 to 12 hours ago. The problem occurs rarely. The problem has been resolved. Pertinent negatives include no chest pain, no abdominal pain and no shortness of breath. Associated symptoms comments: Hit head on wall with syncopal event.  Has not been drinking much water due to work.  . Nothing aggravates the symptoms. Nothing relieves the symptoms. She has tried nothing for the symptoms. The treatment provided moderate relief.       Home Medications Prior to Admission medications   Medication Sig Start Date End Date Taking? Authorizing Provider  BENZACLIN gel Apply topically every morning. 05/10/19   Georges Mouse, NP  EPINEPHrine 0.3 mg/0.3 mL IJ SOAJ injection Inject 0.3 mLs (0.3 mg total) into the muscle as needed for anaphylaxis. 12/23/18   Alene Mires, NP  ibuprofen (ADVIL) 800 MG tablet Take 1 tablet (800 mg total) by mouth 3 (three) times daily. 02/23/21   Hyman Hopes, Margaux, PA-C  tretinoin (RETIN-A) 0.025 % cream Apply topically at bedtime. 05/14/19   Verneda Skill, FNP  cetirizine (ZYRTEC) 10 MG tablet Take 1 tablet (10 mg total) by mouth daily as needed for allergies. Patient not taking: Reported on 04/04/2018 08/17/17 11/16/18  Hollice Gong, MD  fluticasone [facility]) 50 MCG/ACT nasal spray Place 2 sprays into both nostrils daily. Patient not taking: Reported on 04/04/2018 08/17/17 11/16/18  Hollice Gong, MD      Allergies    Pollen extract and Shrimp [shellfish allergy]    Review of Systems   Review of Systems  Constitutional:  Negative for fever.  HENT:  Negative for facial swelling.   Eyes:  Negative for redness.   Respiratory:  Negative for shortness of breath, wheezing and stridor.   Cardiovascular:  Negative for chest pain and leg swelling.  Gastrointestinal:  Negative for abdominal pain.  All other systems reviewed and are negative.   Physical Exam Updated Vital Signs BP 121/69 (BP Location: Left Arm)   Pulse 81   Temp 98.7 F (37.1 C) (Oral)   Resp 20   SpO2 100%  Physical Exam Vitals and nursing note reviewed.  Constitutional:      General: She is not in acute distress.    Appearance: Normal appearance. She is well-developed.  HENT:     Head: Normocephalic and atraumatic.     Nose: Nose normal.  Eyes:     Pupils: Pupils are equal, round, and reactive to light.  Cardiovascular:     Rate and Rhythm: Normal rate and regular rhythm.     Pulses: Normal pulses.     Heart sounds: Normal heart sounds.  Pulmonary:     Effort: Pulmonary effort is normal. No respiratory distress.     Breath sounds: Normal breath sounds.  Abdominal:     General: Bowel sounds are normal. There is no distension.     Palpations: Abdomen is soft.     Tenderness: There is no abdominal tenderness. There is no guarding or rebound.  Musculoskeletal:        General: Normal range of motion.     Cervical back: Normal range of motion and neck  supple.  Skin:    General: Skin is warm and dry.     Capillary Refill: Capillary refill takes less than 2 seconds.     Findings: No erythema or rash.  Neurological:     General: No focal deficit present.     Mental Status: She is alert and oriented to person, place, and time.     Deep Tendon Reflexes: Reflexes normal.  Psychiatric:        Mood and Affect: Mood normal.        Behavior: Behavior normal.     ED Results / Procedures / Treatments   Labs (all labs ordered are listed, but only abnormal results are displayed) Results for orders placed or performed during the hospital encounter of 02/16/23  Basic metabolic panel  Result Value Ref Range   Sodium 136 135 -  145 mmol/L   Potassium 3.7 3.5 - 5.1 mmol/L   Chloride 103 98 - 111 mmol/L   CO2 23 22 - 32 mmol/L   Glucose, Bld 88 70 - 99 mg/dL   BUN 11 6 - 20 mg/dL   Creatinine, Ser 4.25 0.44 - 1.00 mg/dL   Calcium 9.1 8.9 - 95.6 mg/dL   GFR, Estimated >38 >75 mL/min   Anion gap 10 5 - 15  CBC  Result Value Ref Range   WBC 3.5 (L) 4.0 - 10.5 K/uL   RBC 4.19 3.87 - 5.11 MIL/uL   Hemoglobin 12.4 12.0 - 15.0 g/dL   HCT 64.3 32.9 - 51.8 %   MCV 87.6 80.0 - 100.0 fL   MCH 29.6 26.0 - 34.0 pg   MCHC 33.8 30.0 - 36.0 g/dL   RDW 84.1 66.0 - 63.0 %   Platelets 179 150 - 400 K/uL   nRBC 0.0 0.0 - 0.2 %  Urinalysis, Routine w reflex microscopic -Urine, Clean Catch  Result Value Ref Range   Color, Urine YELLOW YELLOW   APPearance CLEAR CLEAR   Specific Gravity, Urine 1.015 1.005 - 1.030   pH 5.0 5.0 - 8.0   Glucose, UA NEGATIVE NEGATIVE mg/dL   Hgb urine dipstick NEGATIVE NEGATIVE   Bilirubin Urine NEGATIVE NEGATIVE   Ketones, ur 20 (A) NEGATIVE mg/dL   Protein, ur NEGATIVE NEGATIVE mg/dL   Nitrite NEGATIVE NEGATIVE   Leukocytes,Ua NEGATIVE NEGATIVE  hCG, serum, qualitative  Result Value Ref Range   Preg, Serum NEGATIVE NEGATIVE  CBG monitoring, ED  Result Value Ref Range   Glucose-Capillary 87 70 - 99 mg/dL   CT Head Wo Contrast  Result Date: 02/16/2023 CLINICAL DATA:  Fall EXAM: CT HEAD WITHOUT CONTRAST CT CERVICAL SPINE WITHOUT CONTRAST TECHNIQUE: Multidetector CT imaging of the head and cervical spine was performed following the standard protocol without intravenous contrast. Multiplanar CT image reconstructions of the cervical spine were also generated. RADIATION DOSE REDUCTION: This exam was performed according to the departmental dose-optimization program which includes automated exposure control, adjustment of the mA and/or kV according to patient size and/or use of iterative reconstruction technique. COMPARISON:  None Available. FINDINGS: CT HEAD FINDINGS Brain: There is no mass,  hemorrhage or extra-axial collection. The size and configuration of the ventricles and extra-axial CSF spaces are normal. The brain parenchyma is normal, without evidence of acute or chronic infarction. Vascular: No abnormal hyperdensity of the major intracranial arteries or dural venous sinuses. No intracranial atherosclerosis. Skull: The visualized skull base, calvarium and extracranial soft tissues are normal. Sinuses/Orbits: No fluid levels or advanced mucosal thickening of the visualized paranasal sinuses. No  mastoid or middle ear effusion. The orbits are normal. CT CERVICAL SPINE FINDINGS Alignment: No static subluxation. Facets are aligned. Occipital condyles are normally positioned. Skull base and vertebrae: No acute fracture. Soft tissues and spinal canal: No prevertebral fluid or swelling. No visible canal hematoma. Disc levels: No advanced spinal canal or neural foraminal stenosis. Upper chest: No pneumothorax, pulmonary nodule or pleural effusion. Other: Normal visualized paraspinal cervical soft tissues. IMPRESSION: 1. No acute intracranial abnormality. 2. No acute fracture or static subluxation of the cervical spine. Electronically Signed   By: Deatra Robinson M.D.   On: 02/16/2023 02:38   CT Cervical Spine Wo Contrast  Result Date: 02/16/2023 CLINICAL DATA:  Fall EXAM: CT HEAD WITHOUT CONTRAST CT CERVICAL SPINE WITHOUT CONTRAST TECHNIQUE: Multidetector CT imaging of the head and cervical spine was performed following the standard protocol without intravenous contrast. Multiplanar CT image reconstructions of the cervical spine were also generated. RADIATION DOSE REDUCTION: This exam was performed according to the departmental dose-optimization program which includes automated exposure control, adjustment of the mA and/or kV according to patient size and/or use of iterative reconstruction technique. COMPARISON:  None Available. FINDINGS: CT HEAD FINDINGS Brain: There is no mass, hemorrhage or  extra-axial collection. The size and configuration of the ventricles and extra-axial CSF spaces are normal. The brain parenchyma is normal, without evidence of acute or chronic infarction. Vascular: No abnormal hyperdensity of the major intracranial arteries or dural venous sinuses. No intracranial atherosclerosis. Skull: The visualized skull base, calvarium and extracranial soft tissues are normal. Sinuses/Orbits: No fluid levels or advanced mucosal thickening of the visualized paranasal sinuses. No mastoid or middle ear effusion. The orbits are normal. CT CERVICAL SPINE FINDINGS Alignment: No static subluxation. Facets are aligned. Occipital condyles are normally positioned. Skull base and vertebrae: No acute fracture. Soft tissues and spinal canal: No prevertebral fluid or swelling. No visible canal hematoma. Disc levels: No advanced spinal canal or neural foraminal stenosis. Upper chest: No pneumothorax, pulmonary nodule or pleural effusion. Other: Normal visualized paraspinal cervical soft tissues. IMPRESSION: 1. No acute intracranial abnormality. 2. No acute fracture or static subluxation of the cervical spine. Electronically Signed   By: Deatra Robinson M.D.   On: 02/16/2023 02:38    EKG EKG Interpretation Date/Time:  Wednesday February 16 2023 00:50:40 EDT Ventricular Rate:  82 PR Interval:  131 QRS Duration:  88 QT Interval:  360 QTC Calculation: 421 R Axis:   64  Text Interpretation: Sinus rhythm Confirmed by Nicanor Alcon, Kalab Camps ([id number]) on 02/16/2023 3:56:20 AM  Radiology CT Head Wo Contrast  Result Date: 02/16/2023 CLINICAL DATA:  Fall EXAM: CT HEAD WITHOUT CONTRAST CT CERVICAL SPINE WITHOUT CONTRAST TECHNIQUE: Multidetector CT imaging of the head and cervical spine was performed following the standard protocol without intravenous contrast. Multiplanar CT image reconstructions of the cervical spine were also generated. RADIATION DOSE REDUCTION: This exam was performed according to the  departmental dose-optimization program which includes automated exposure control, adjustment of the mA and/or kV according to patient size and/or use of iterative reconstruction technique. COMPARISON:  None Available. FINDINGS: CT HEAD FINDINGS Brain: There is no mass, hemorrhage or extra-axial collection. The size and configuration of the ventricles and extra-axial CSF spaces are normal. The brain parenchyma is normal, without evidence of acute or chronic infarction. Vascular: No abnormal hyperdensity of the major intracranial arteries or dural venous sinuses. No intracranial atherosclerosis. Skull: The visualized skull base, calvarium and extracranial soft tissues are normal. Sinuses/Orbits: No fluid levels or advanced mucosal thickening of  the visualized paranasal sinuses. No mastoid or middle ear effusion. The orbits are normal. CT CERVICAL SPINE FINDINGS Alignment: No static subluxation. Facets are aligned. Occipital condyles are normally positioned. Skull base and vertebrae: No acute fracture. Soft tissues and spinal canal: No prevertebral fluid or swelling. No visible canal hematoma. Disc levels: No advanced spinal canal or neural foraminal stenosis. Upper chest: No pneumothorax, pulmonary nodule or pleural effusion. Other: Normal visualized paraspinal cervical soft tissues. IMPRESSION: 1. No acute intracranial abnormality. 2. No acute fracture or static subluxation of the cervical spine. Electronically Signed   By: Deatra Robinson M.D.   On: 02/16/2023 02:38   CT Cervical Spine Wo Contrast  Result Date: 02/16/2023 CLINICAL DATA:  Fall EXAM: CT HEAD WITHOUT CONTRAST CT CERVICAL SPINE WITHOUT CONTRAST TECHNIQUE: Multidetector CT imaging of the head and cervical spine was performed following the standard protocol without intravenous contrast. Multiplanar CT image reconstructions of the cervical spine were also generated. RADIATION DOSE REDUCTION: This exam was performed according to the departmental  dose-optimization program which includes automated exposure control, adjustment of the mA and/or kV according to patient size and/or use of iterative reconstruction technique. COMPARISON:  None Available. FINDINGS: CT HEAD FINDINGS Brain: There is no mass, hemorrhage or extra-axial collection. The size and configuration of the ventricles and extra-axial CSF spaces are normal. The brain parenchyma is normal, without evidence of acute or chronic infarction. Vascular: No abnormal hyperdensity of the major intracranial arteries or dural venous sinuses. No intracranial atherosclerosis. Skull: The visualized skull base, calvarium and extracranial soft tissues are normal. Sinuses/Orbits: No fluid levels or advanced mucosal thickening of the visualized paranasal sinuses. No mastoid or middle ear effusion. The orbits are normal. CT CERVICAL SPINE FINDINGS Alignment: No static subluxation. Facets are aligned. Occipital condyles are normally positioned. Skull base and vertebrae: No acute fracture. Soft tissues and spinal canal: No prevertebral fluid or swelling. No visible canal hematoma. Disc levels: No advanced spinal canal or neural foraminal stenosis. Upper chest: No pneumothorax, pulmonary nodule or pleural effusion. Other: Normal visualized paraspinal cervical soft tissues. IMPRESSION: 1. No acute intracranial abnormality. 2. No acute fracture or static subluxation of the cervical spine. Electronically Signed   By: Deatra Robinson M.D.   On: 02/16/2023 02:38    Procedures Procedures    Medications Ordered in ED Medications  lidocaine (LIDODERM) 5 % 1 patch (has no administration in time range)  acetaminophen (TYLENOL) tablet 1,000 mg (1,000 mg Oral Given 02/16/23 0542)  naproxen (NAPROSYN) tablet 500 mg (500 mg Oral Given 02/16/23 0542)    ED Course/ Medical Decision Making/ A&P                                 Medical Decision Making Patient with syncopal event and hit head   Amount and/or Complexity of  Data Reviewed Independent Historian: parent    Details: See above  External Data Reviewed: notes.    Details: Previous notes reviewed  Labs: ordered.    Details: Normal sodium 136, normal potassium 3.7, normal creatinine 0.85. white count slight low 3.5, normal 12.4, normal platelets.  Urine negative for UTI Radiology: ordered and independent interpretation performed.    Details: Negative head CT by me  ECG/medicine tests: ordered and independent interpretation performed. Decision-making details documented in ED Course.  Risk OTC drugs. Prescription drug management. Risk Details: Patient is well appearing.  Exam, vitals and labs are benign and reassuring.  No travel,  no OCP.  No CP or SOB.  PERC negative.  Drink more fluids.  Stable for discharge.  Strict return.      Final Clinical Impression(s) / ED Diagnoses Final diagnoses:  Fall, initial encounter  Syncope, unspecified syncope type   Return for intractable cough, coughing up blood, fevers > 100.4 unrelieved by medication, shortness of breath, intractable vomiting, chest pain, shortness of breath, weakness, numbness, changes in speech, facial asymmetry, abdominal pain, passing out, Inability to tolerate liquids or food, cough, altered mental status or any concerns. No signs of systemic illness or infection. The patient is nontoxic-appearing on exam and vital signs are within normal limits.  I have reviewed the triage vital signs and the nursing notes. Pertinent labs & imaging results that were available during my care of the patient were reviewed by me and considered in my medical decision making (see chart for details). After history, exam, and medical workup I feel the patient has been appropriately medically screened and is safe for discharge home. Pertinent diagnoses were discussed with the patient. Patient was given return precautions Rx / DC Orders ED Discharge Orders     None         Audre Cenci, MD 02/16/23 [phone]

## 2023-02-16 NOTE — ED Triage Notes (Signed)
Pt presents via EMS c/o possible syncopal episode at home. EMS reports pt fell and hit head against the fall and floor. Denies anticoagulation. Per EMS, mother reported pt was unresponsive for a few seconds on the ground. A&O x4 on arrival. Denies dizziness currently. Reports right sided neck pain.

## 2023-02-18 ENCOUNTER — Ambulatory Visit
Admission: EM | Admit: 2023-02-18 | Discharge: 2023-02-18 | Disposition: A | Discharge status: Home / Self Care | Payer: Medicaid Other | Attending: Nurse Practitioner | Admitting: Nurse Practitioner

## 2023-02-18 DIAGNOSIS — S060X9D Concussion with loss of consciousness of unspecified duration, subsequent encounter: Secondary | ICD-10-CM | POA: Insufficient documentation

## 2023-02-18 DIAGNOSIS — Z113 Encounter for screening for infections with a predominantly sexual mode of transmission: Secondary | ICD-10-CM | POA: Diagnosis present

## 2023-02-18 DIAGNOSIS — S0990XD Unspecified injury of head, subsequent encounter: Secondary | ICD-10-CM | POA: Diagnosis present

## 2023-02-18 MED ORDER — IBUPROFEN 800 MG PO TABS: 800.0000 mg | ORAL_TABLET | Freq: Three times a day (TID) | ORAL | 0 refills | Status: DC

## 2023-02-18 NOTE — ED Provider Notes (Signed)
[facility]    CSN: [id number] Arrival date & time: 02/18/23  1609      History   Chief Complaint Chief Complaint  Patient presents with   Fall   Neck Pain   Headache   SEXUALLY TRANSMITTED DISEASE    HPI Natasha Douglas is a 22 y.o. female.   HPI  She is in today for reevaluation of persistent headache.  She reports that she she was walking hit a wall fell back and hit her head and on 2 occasions on a [place] countertop.  She was evaluated in the emergency room and CT scan was negative vital signs stable labs were within normal range.  She was released and encouraged to take over-the-counter medications.  She reports that Tylenol has not been effective.  She has returned to work but noticed that she is having a delay in responses.  She endorses 0.5 [employer] care without starting it.  And she does reports that the headache is getting worse she denies any nausea or vomiting.  She is having some sensitivity to light.  She reports that the pain is radiating to the front of her head.  She denies dizziness or blurred vision at this time.  She reports that her last menstrual cycle was approximately 1 week ago.  She does have a history of iron deficiency anemia however she is no longer taking iron supplements.  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  She reports that she did undergo a bone marrow biopsy at [facility].  She reports that it was normal.  She is currently on vitamin B12 injections.  She denies any family history of any blood cancers, autoimmune disorders etc. She is requesting STD testing.  She denies any symptoms or any recent sexual activity.  She just reports that this is something that she likes to do periodically. Denies any pelvic pain or tenderness, amenorrhea irregular bleeding or prolonged heavy bleeding.  Denies vaginal discharge or dysuria.  Denies ulcers or lesions  Past Medical History:  Diagnosis Date   Allergy    Anemia    abstracted from  outside medical records.    Angio-edema    Concussion 2012   Displacement of intervertebral disc, site unspecified, without myelopathy    age 69 - abstracted from oustide medical record.     Eczema    Herpes gingivostomatitis    age 78 (abstracted from outside medical records)   Proteinuria 2012   abstracted from outside medical record - random urine protein 19 mg/dL / Random cr 440.1  = normal PC ratio   Urticaria     Patient Active Problem List   Diagnosis Date Noted   Ingrown toenail 03/27/2019   Encounter for other contraceptive management 04/04/2018   Failed hearing screening 09/26/2017   Allergic reaction 07/20/2016   Allergic conjunctivitis 07/20/2016   Atopic dermatitis 07/20/2016   Food allergy 06/01/2016   Left knee injury 10/01/2014   Failed vision screen 10/01/2014   Perennial and seasonal allergic rhinitis 01/31/2013    Past Surgical History:  Procedure Laterality Date   NO PAST SURGERIES      OB History     Gravida  0   Para  0   Term  0   Preterm  0   AB  0   Living         SAB  0   IAB  0   Ectopic  0   Multiple  Live Births               Home Medications    Prior to Admission medications   Medication Sig Start Date End Date Taking? Authorizing Provider  ibuprofen (ADVIL) 800 MG tablet Take 1 tablet (800 mg total) by mouth 3 (three) times daily. 02/18/23  Yes Barbette Merino, NP  BENZACLIN gel Apply topically every morning. 05/10/19   Georges Mouse, NP  EPINEPHrine 0.3 mg/0.3 mL IJ SOAJ injection Inject 0.3 mLs (0.3 mg total) into the muscle as needed for anaphylaxis. 12/23/18   Alene Mires, NP  ibuprofen (ADVIL) 800 MG tablet Take 1 tablet (800 mg total) by mouth 3 (three) times daily. 02/23/21   Hyman Hopes, Margaux, PA-C  tretinoin (RETIN-A) 0.025 % cream Apply topically at bedtime. 05/14/19   Verneda Skill, FNP  cetirizine (ZYRTEC) 10 MG tablet Take 1 tablet (10 mg total) by mouth daily as needed for  allergies. Patient not taking: Reported on 04/04/2018 08/17/17 11/16/18  Hollice Gong, MD  fluticasone [facility]) 50 MCG/ACT nasal spray Place 2 sprays into both nostrils daily. Patient not taking: Reported on 04/04/2018 08/17/17 11/16/18  Hollice Gong, MD    Family History Family History  Problem Relation Age of Onset   Allergic rhinitis Mother     Social History Social History   Tobacco Use   Smoking status: Never   Smokeless tobacco: Never  Vaping Use   Vaping status: Never Used  Substance Use Topics   Alcohol use: No   Drug use: No     Allergies   Pollen extract and Shrimp [shellfish allergy]   Review of Systems Review of Systems   Physical Exam Triage Vital Signs ED Triage Vitals  Encounter Vitals Group     BP 02/18/23 1624 116/76     Systolic BP Percentile --      Diastolic BP Percentile --      Pulse Rate 02/18/23 1624 77     Resp 02/18/23 1624 17     Temp 02/18/23 1624 98.1 F (36.7 C)     Temp Source 02/18/23 1624 Oral     SpO2 02/18/23 1624 98 %     Weight --      Height --      Head Circumference --      Peak Flow --      Pain Score 02/18/23 1623 8     Pain Loc --      Pain Education --      Exclude from Growth Chart --    No data found.  Updated Vital Signs BP 116/76 (BP Location: Right Arm)   Pulse 77   Temp 98.1 F (36.7 C) (Oral)   Resp 17   LMP 02/06/2023 (Exact Date)   SpO2 98%   Visual Acuity Right Eye Distance:   Left Eye Distance:   Bilateral Distance:    Right Eye Near:   Left Eye Near:    Bilateral Near:     Physical Exam Constitutional:      Appearance: She is normal weight.  HENT:     Head: Normocephalic.  Cardiovascular:     Rate and Rhythm: Normal rate.  Pulmonary:     Effort: Pulmonary effort is normal.  Neurological:     Mental Status: She is alert.     GCS: GCS eye subscore is 4. GCS verbal subscore is 5. GCS motor subscore is 6.     Cranial Nerves: No cranial nerve deficit.  Sensory: No sensory  deficit.  Psychiatric:        Mood and Affect: Mood normal.        Behavior: Behavior normal.      UC Treatments / Results  Labs (all labs ordered are listed, but only abnormal results are displayed) Labs Reviewed  HIV ANTIBODY (ROUTINE TESTING W REFLEX)  CERVICOVAGINAL ANCILLARY ONLY    EKG   Radiology No results found.  Procedures Procedures (including critical care time)  Medications Ordered in UC Medications - No data to display  Initial Impression / Assessment and Plan / UC Course  I have reviewed the triage vital signs and the nursing notes.  Pertinent labs & imaging results that were available during my care of the patient were reviewed by me and considered in my medical decision making (see chart for details).     Headache follow-up fall STDs Final Clinical Impressions(s) / UC Diagnoses   Final diagnoses:  Screening for STD (sexually transmitted disease)  Injury of head, subsequent encounter  Concussion with loss of consciousness, subsequent encounter     Discharge Instructions      You have been diagnosed with a concussion.  A referral has been placed to the sports medicine office.  They specialize in treating concussions you are encouraged to call them on Monday to set up an appointment.  You have also been prescribed ibuprofen 800 mg due to the ongoing headaches.  The recommendation is for you to rest with low stimulation over the weekend.  If you develop any nausea vomiting over the weekend you are to return to the emergency department. You have been screened for STDs per your request.  Your labs are pending you will be notified for any positive results along with treatment recommendations.  If you have not received a call from our office you are able to view your lab results MyChart.    ED Prescriptions     Medication Sig Dispense Auth. Provider   ibuprofen (ADVIL) 800 MG tablet Take 1 tablet (800 mg total) by mouth 3 (three) times daily. 21  tablet Barbette Merino, NP      PDMP not reviewed this encounter.   Barbette Merino, NP 02/18/23 [phone]

## 2023-02-18 NOTE — ED Triage Notes (Addendum)
Pt presents with c/o x headache after a fall from passing out on Tuesday. Pt states her neck pain and headache are worsening.   Pt is requesting std testing and blood work.

## 2023-02-18 NOTE — Discharge Instructions (Addendum)
You have been diagnosed with a concussion.  A referral has been placed to the sports medicine office.  They specialize in treating concussions you are encouraged to call them on Monday to set up an appointment.  You have also been prescribed ibuprofen 800 mg due to the ongoing headaches.  The recommendation is for you to rest with low stimulation over the weekend.  If you develop any nausea vomiting over the weekend you are to return to the emergency department. You have been screened for STDs per your request.  Your labs are pending you will be notified for any positive results along with treatment recommendations.  If you have not received a call from our office you are able to view your lab results MyChart.

## 2023-02-19 LAB — HIV ANTIBODY (ROUTINE TESTING W REFLEX): HIV Screen 4th Generation wRfx: NONREACTIVE

## 2023-02-21 LAB — CERVICOVAGINAL ANCILLARY ONLY
Bacterial Vaginitis (gardnerella): NEGATIVE
Candida Glabrata: NEGATIVE
Candida Vaginitis: NEGATIVE
Chlamydia: NEGATIVE
Comment: NEGATIVE
Comment: NEGATIVE
Comment: NEGATIVE
Comment: NEGATIVE
Comment: NEGATIVE
Comment: NORMAL
Neisseria Gonorrhea: NEGATIVE
Trichomonas: NEGATIVE

## 2023-03-28 ENCOUNTER — Ambulatory Visit: Payer: Self-pay

## 2023-06-23 ENCOUNTER — Ambulatory Visit
Admission: EM | Admit: 2023-06-23 | Discharge: 2023-06-23 | Disposition: A | Discharge status: Home / Self Care | Attending: Family Medicine | Admitting: Family Medicine

## 2023-06-23 DIAGNOSIS — R112 Nausea with vomiting, unspecified: Secondary | ICD-10-CM

## 2023-06-23 DIAGNOSIS — R197 Diarrhea, unspecified: Secondary | ICD-10-CM | POA: Diagnosis not present

## 2023-06-23 DIAGNOSIS — A084 Viral intestinal infection, unspecified: Secondary | ICD-10-CM | POA: Diagnosis not present

## 2023-06-23 MED ORDER — ONDANSETRON 8 MG PO TBDP: 8.0000 mg | ORAL_TABLET | Freq: Three times a day (TID) | ORAL | 0 refills | Status: AC | PRN

## 2023-06-23 MED ORDER — LOPERAMIDE HCL 2 MG PO CAPS: 2.0000 mg | ORAL_CAPSULE | Freq: Two times a day (BID) | ORAL | 0 refills | Status: AC | PRN

## 2023-06-23 NOTE — ED Triage Notes (Signed)
 Pt reports nausea, vomiting and headache after she ate a wrap at work yesterday.

## 2023-06-23 NOTE — ED Provider Notes (Signed)
 [name]  Note:  This document was prepared using Conservation officer, historic buildings and may include unintentional dictation errors.  MRN: [id number] DOB: 08/19/2000  Subjective:   Natasha Douglas is a 23 y.o. female presenting for 1 day history of acute onset nausea, vomiting, diarrhea, upset stomach and belly pain.  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  No bloody stools.  No fever, recent antibiotic use, hospitalizations or long distance travel.  Has not eaten raw foods, drank unfiltered water.  No history of GI disorders including Crohn's, IBS, ulcerative colitis.   No current facility-administered medications for this encounter.  Current Outpatient Medications:    BENZACLIN gel, Apply topically every morning., Disp: 25 g, Rfl: 11   drospirenone-ethinyl estradiol (YAZ) 3-0.02 MG tablet, Take 1 tablet by mouth daily., Disp: , Rfl:    EPINEPHrine 0.3 mg/0.3 mL IJ SOAJ injection, Inject 0.3 mLs (0.3 mg total) into the muscle as needed for anaphylaxis., Disp: 1 each, Rfl: 0   ibuprofen (ADVIL) 800 MG tablet, Take 1 tablet (800 mg total) by mouth 3 (three) times daily., Disp: 21 tablet, Rfl: 0   ibuprofen (ADVIL) 800 MG tablet, Take 1 tablet (800 mg total) by mouth 3 (three) times daily., Disp: 21 tablet, Rfl: 0   tretinoin (RETIN-A) 0.025 % cream, Apply topically at bedtime., Disp: 45 g, Rfl: 0   Allergies  Allergen Reactions   Shellfish Allergy Hives    Facial swelling   Pollen Extract     Sneezing itchy eyes and runny nose    Past Medical History:  Diagnosis Date   Allergy    Anemia    abstracted from outside medical records.    Angio-edema    Concussion 2012   Displacement of intervertebral disc, site unspecified, without myelopathy    age 34 - abstracted from oustide medical record.     Eczema    Herpes gingivostomatitis    age 52 (abstracted from outside medical records)    Proteinuria 2012   abstracted from outside medical record - random urine protein 19 mg/dL / Random cr 191.4  = normal PC ratio   Urticaria      Past Surgical History:  Procedure Laterality Date   NO PAST SURGERIES      Family History  Problem Relation Age of Onset   Allergic rhinitis Mother     Social History   Tobacco Use   Smoking status: Never   Smokeless tobacco: Never  Vaping Use   Vaping status: Never Used  Substance Use Topics   Alcohol use: Yes    Comment: Occa   Drug use: No    ROS   Objective:   Vitals: BP 131/82 (BP Location: Left Arm)   Pulse 80   Temp 99.9 F (37.7 C) (Oral)   Resp 16   SpO2 98%   Physical Exam Constitutional:      General: She is not in acute distress.    Appearance: Normal appearance. She is well-developed. She is not ill-appearing, toxic-appearing or diaphoretic.  HENT:     Head: Normocephalic and atraumatic.     Nose: Nose normal.     Mouth/Throat:     Mouth: Mucous membranes are moist.  Eyes:     General: No scleral icterus.       Right eye: No discharge.        Left eye: No discharge.  Extraocular Movements: Extraocular movements intact.     Conjunctiva/sclera: Conjunctivae normal.  Cardiovascular:     Rate and Rhythm: Normal rate.  Pulmonary:     Effort: Pulmonary effort is normal.  Abdominal:     General: Bowel sounds are increased. There is no distension.     Palpations: Abdomen is soft. There is no mass.     Tenderness: There is generalized abdominal tenderness. There is no right CVA tenderness, left CVA tenderness, guarding or rebound.  Skin:    General: Skin is warm and dry.  Neurological:     General: No focal deficit present.     Mental Status: She is alert and oriented to person, place, and time.  Psychiatric:        Mood and Affect: Mood normal.        Behavior: Behavior normal.        Thought Content: Thought content normal.        Judgment: Judgment normal.     Assessment and Plan :    PDMP not reviewed this encounter.  1. Viral gastroenteritis   2. Nausea vomiting and diarrhea    No signs of an acute abdomen. Will manage for suspected viral gastroenteritis with supportive care.  Recommended patient hydrate well, eat light meals and maintain electrolytes.  Will use Zofran and Imodium for nausea, vomiting and diarrhea. Counseled patient on potential for adverse effects with medications prescribed/recommended today, ER and return-to-clinic precautions discussed, patient verbalized understanding.    [name], [place] 06/23/23 [phone]

## 2023-06-23 NOTE — Discharge Instructions (Addendum)
 We are managing you for a viral gastroenteritis. Make sure you push fluids drinking mostly water but mix it with Gatorade.  Try to eat light meals including soups, broths and soft foods, fruits.  You may use Zofran for your nausea and vomiting once every 8 hours.  Imodium can help with diarrhea but use this carefully limiting it to 1-2 times per day only if you are having a lot of diarrhea.  Please return to the clinic if symptoms worsen or you start having severe abdominal pain not helped by taking Tylenol or start having bloody stools or blood in the vomit.

## 2023-09-10 ENCOUNTER — Ambulatory Visit: Payer: Self-pay | Admitting: Urgent Care

## 2023-09-10 ENCOUNTER — Ambulatory Visit (INDEPENDENT_AMBULATORY_CARE_PROVIDER_SITE_OTHER)

## 2023-09-10 ENCOUNTER — Ambulatory Visit
Admission: RE | Admit: 2023-09-10 | Discharge: 2023-09-10 | Disposition: A | Discharge status: Home / Self Care | Source: Ambulatory Visit | Attending: Family Medicine | Admitting: Family Medicine

## 2023-09-10 VITALS — BP 126/90 | HR 83 | Temp 99.2°F | Resp 16

## 2023-09-10 DIAGNOSIS — S76011A Strain of muscle, fascia and tendon of right hip, initial encounter: Secondary | ICD-10-CM | POA: Diagnosis not present

## 2023-09-10 DIAGNOSIS — M25551 Pain in right hip: Secondary | ICD-10-CM | POA: Diagnosis not present

## 2023-09-10 MED ORDER — NAPROXEN 500 MG PO TABS: 500.0000 mg | ORAL_TABLET | Freq: Two times a day (BID) | ORAL | 0 refills | Status: AC

## 2023-09-10 NOTE — ED Provider Notes (Signed)
 [name]  Note:  This document was prepared using Conservation officer, historic buildings and may include unintentional dictation errors.  MRN: [id number] DOB: Aug 15, 2000  Subjective:   FLEDA PAGEL is a 23 y.o. female presenting for 2-week history of persistent right hip pain, medial thigh pain.  Worse in the past couple of days.  No fall, trauma.  No urinary symptoms.  No rashes.  Patient reports that symptoms started after she began to workout and exercise again.  Has a long hiatus and was not very active until recently.  On OCP.   No current facility-administered medications for this encounter.  Current Outpatient Medications:    drospirenone-ethinyl estradiol (YAZ) 3-0.02 MG tablet, Take 1 tablet by mouth daily., Disp: , Rfl:    BENZACLIN  gel, Apply topically every morning., Disp: 25 g, Rfl: 11   EPINEPHrine  0.3 mg/0.3 mL IJ SOAJ injection, Inject 0.3 mLs (0.3 mg total) into the muscle as needed for anaphylaxis., Disp: 1 each, Rfl: 0   ibuprofen  (ADVIL ) 800 MG tablet, Take 1 tablet (800 mg total) by mouth 3 (three) times daily., Disp: 21 tablet, Rfl: 0   ibuprofen  (ADVIL ) 800 MG tablet, Take 1 tablet (800 mg total) by mouth 3 (three) times daily., Disp: 21 tablet, Rfl: 0   loperamide  (IMODIUM ) 2 MG capsule, Take 1 capsule (2 mg total) by mouth 2 (two) times daily as needed for diarrhea or loose stools., Disp: 14 capsule, Rfl: 0   ondansetron  (ZOFRAN -ODT) 8 MG disintegrating tablet, Take 1 tablet (8 mg total) by mouth every 8 (eight) hours as needed for nausea or vomiting., Disp: 20 tablet, Rfl: 0   tretinoin  (RETIN-A ) 0.025 % cream, Apply topically at bedtime., Disp: 45 g, Rfl: 0   Allergies  Allergen Reactions   Shellfish Allergy  Hives    Facial swelling   Pollen Extract     Sneezing itchy eyes and runny nose    Past Medical History:  Diagnosis Date   Allergy     Anemia    abstracted from outside medical records.    Angio-edema    Concussion 2012    Displacement of intervertebral disc, site unspecified, without myelopathy    age 37 - abstracted from [facility]ustide medical record.     Eczema    Herpes gingivostomatitis    age 69 (abstracted from outside medical records)   Proteinuria 2012   abstracted from outside medical record - random urine protein 19 mg/dL / Random cr 409.8  = normal PC ratio   Urticaria      Past Surgical History:  Procedure Laterality Date   NO PAST SURGERIES      Family History  Problem Relation Age of Onset   Allergic rhinitis Mother     Social History   Tobacco Use   Smoking status: Never   Smokeless tobacco: Never  Vaping Use   Vaping status: Never Used  Substance Use Topics   Alcohol use: Yes    Comment: Occa   Drug use: No    ROS   Objective:   Vitals: BP (!) 126/90 (BP Location: Left Arm)   Pulse 83   Temp 99.2 F (37.3 C) (Oral)   Resp 16   LMP 08/08/2023 (Approximate)   SpO2 98%   Physical Exam Constitutional:      General: She is not in acute distress.    Appearance: Normal appearance. She is well-developed. She is not ill-appearing, toxic-appearing or diaphoretic.  HENT:     Head: Normocephalic and  atraumatic.     Nose: Nose normal.     Mouth/Throat:     Mouth: Mucous membranes are moist.  Eyes:     General: No scleral icterus.       Right eye: No discharge.        Left eye: No discharge.     Extraocular Movements: Extraocular movements intact.  Cardiovascular:     Rate and Rhythm: Normal rate.  Pulmonary:     Effort: Pulmonary effort is normal.  Musculoskeletal:     Right hip: Tenderness (across area outlined) present. No deformity, lacerations, bony tenderness or crepitus. Decreased range of motion. Normal strength.       Legs:  Skin:    General: Skin is warm and dry.  Neurological:     General: No focal deficit present.     Mental Status: She is alert and oriented to person, place, and time.  Psychiatric:        Mood and Affect: Mood normal.         Behavior: Behavior normal.     Assessment and Plan :   PDMP not reviewed this encounter.  1. Hip strain, right, initial encounter   2. Right hip pain    X-ray over-read was pending at time of discharge, recommended follow up with only abnormal results. Otherwise will not call for negative over-read. Patient was in agreement.  Suspect hip strain, recommend conservative management with medication and physical activity, naproxen  for pain and inflammation.  Follow-up with [facility] as soon as possible.  Counseled patient on potential for adverse effects with medications prescribed/recommended today, ER and return-to-clinic precautions discussed, patient verbalized understanding.    Adolph Hoop, [place] 09/10/23 [phone]

## 2023-09-10 NOTE — ED Triage Notes (Signed)
 Patient reports right leg pain x 2 days. Patient reports right side inner thigh pain that started months ago. Patient denies any recent injuries at this time.

## 2024-02-29 ENCOUNTER — Ambulatory Visit
Admission: EM | Admit: 2024-02-29 | Discharge: 2024-02-29 | Disposition: A | Discharge status: Home / Self Care | Attending: Family Medicine | Admitting: Family Medicine

## 2024-02-29 ENCOUNTER — Other Ambulatory Visit: Payer: Self-pay

## 2024-02-29 DIAGNOSIS — R051 Acute cough: Secondary | ICD-10-CM | POA: Diagnosis not present

## 2024-02-29 DIAGNOSIS — B349 Viral infection, unspecified: Secondary | ICD-10-CM | POA: Insufficient documentation

## 2024-02-29 DIAGNOSIS — F17200 Nicotine dependence, unspecified, uncomplicated: Secondary | ICD-10-CM | POA: Diagnosis present

## 2024-02-29 DIAGNOSIS — Z113 Encounter for screening for infections with a predominantly sexual mode of transmission: Secondary | ICD-10-CM | POA: Diagnosis not present

## 2024-02-29 LAB — POC COVID19/FLU A&B COMBO
Covid Antigen, POC: NEGATIVE
Influenza A Antigen, POC: NEGATIVE
Influenza B Antigen, POC: NEGATIVE

## 2024-02-29 MED ORDER — METRONIDAZOLE 500 MG PO TABS: 500.0000 mg | ORAL_TABLET | Freq: Two times a day (BID) | ORAL | 0 refills | Status: DC

## 2024-02-29 MED ORDER — ALBUTEROL SULFATE HFA 108 (90 BASE) MCG/ACT IN AERS: 1.0000 | INHALATION_SPRAY | Freq: Four times a day (QID) | RESPIRATORY_TRACT | 0 refills | Status: AC | PRN

## 2024-02-29 MED ORDER — PREDNISONE 20 MG PO TABS: ORAL_TABLET | ORAL | 0 refills | Status: DC

## 2024-02-29 MED ORDER — PROMETHAZINE-DM 6.25-15 MG/5ML PO SYRP: 5.0000 mL | ORAL_SOLUTION | Freq: Three times a day (TID) | ORAL | 0 refills | Status: DC | PRN

## 2024-02-29 NOTE — ED Provider Notes (Signed)
 [name]  Note:  This document was prepared using Conservation officer, historic buildings and may include unintentional dictation errors.  MRN: [id number] DOB: [birth year]  Subjective:   Natasha Douglas is a 23 y.o. female presenting for multiple chief complaints.   Reports 3 day history of clear phlegm from productive cough, chest congestion, headaches, nausea and diarrhea, malaise and fatigue, heavy breathing, fever (highest was 101F). Has used otc meds with minimal relief.  No asthma. Smokes marijuana daily.  Reports 2 day history of unusual vaginal discharge, abnormal smell (now resolved). Denies fever, n/v, abdominal pain, pelvic pain, rashes, dysuria, urinary frequency, hematuria.  No chance of pregnancy per patient. Wants complete STI testing.   No current facility-administered medications for this encounter.  Current Outpatient Medications:    BENZACLIN  gel, Apply topically every morning., Disp: 25 g, Rfl: 11   drospirenone-ethinyl estradiol (YAZ) 3-0.02 MG tablet, Take 1 tablet by mouth daily., Disp: , Rfl:    EPINEPHrine  0.3 mg/0.3 mL IJ SOAJ injection, Inject 0.3 mLs (0.3 mg total) into the muscle as needed for anaphylaxis., Disp: 1 each, Rfl: 0   ibuprofen  (ADVIL ) 800 MG tablet, Take 1 tablet (800 mg total) by mouth 3 (three) times daily., Disp: 21 tablet, Rfl: 0   ibuprofen  (ADVIL ) 800 MG tablet, Take 1 tablet (800 mg total) by mouth 3 (three) times daily., Disp: 21 tablet, Rfl: 0   loperamide  (IMODIUM ) 2 MG capsule, Take 1 capsule (2 mg total) by mouth 2 (two) times daily as needed for diarrhea or loose stools., Disp: 14 capsule, Rfl: 0   naproxen  (NAPROSYN ) 500 MG tablet, Take 1 tablet (500 mg total) by mouth 2 (two) times daily with a meal., Disp: 30 tablet, Rfl: 0   ondansetron  (ZOFRAN -ODT) 8 MG disintegrating tablet, Take 1 tablet (8 mg total) by mouth every 8 (eight) hours as needed for nausea or vomiting., Disp: 20 tablet, Rfl: 0   tretinoin   (RETIN-A ) 0.025 % cream, Apply topically at bedtime., Disp: 45 g, Rfl: 0   Allergies  Allergen Reactions   Shellfish Allergy  Hives    Facial swelling   Pollen Extract     Sneezing itchy eyes and runny nose    Past Medical History:  Diagnosis Date   Allergy     Anemia    abstracted from outside medical records.    Angio-edema    Concussion 2012   Displacement of intervertebral disc, site unspecified, without myelopathy    age 22 - abstracted from [facility] medical record.     Eczema    Herpes gingivostomatitis    age 2 (abstracted from outside medical records)   Proteinuria 2012   abstracted from outside medical record - random urine protein 19 mg/dL / Random cr 833.0  = normal PC ratio   Urticaria      Past Surgical History:  Procedure Laterality Date   NO PAST SURGERIES      Family History  Problem Relation Age of Onset   Allergic rhinitis Mother     Social History   Tobacco Use   Smoking status: Never   Smokeless tobacco: Never  Vaping Use   Vaping status: Never Used  Substance Use Topics   Alcohol use: Yes    Comment: Occa   Drug use: No    ROS   Objective:   Vitals: BP 134/84 (BP Location: Right Arm)   Pulse (!) 106   Temp 99.1 F (37.3 C) (Oral)   Resp 20   Ht  5' 6 (1.676 m)   Wt 135 lb (61.2 kg)   SpO2 97%   BMI 21.79 kg/m   Physical Exam Constitutional:      General: She is not in acute distress.    Appearance: Normal appearance. She is well-developed and normal weight. She is not ill-appearing, toxic-appearing or diaphoretic.  HENT:     Head: Normocephalic and atraumatic.     Right Ear: Tympanic membrane, ear canal and external ear normal. No drainage or tenderness. No middle ear effusion. There is no impacted cerumen. Tympanic membrane is not erythematous or bulging.     Left Ear: Tympanic membrane, ear canal and external ear normal. No drainage or tenderness.  No middle ear effusion. There is no impacted cerumen. Tympanic membrane is  not erythematous or bulging.     Nose: Nose normal. No congestion or rhinorrhea.     Mouth/Throat:     Mouth: Mucous membranes are moist. No oral lesions.     Pharynx: No pharyngeal swelling, oropharyngeal exudate, posterior oropharyngeal erythema or uvula swelling.     Tonsils: No tonsillar exudate or tonsillar abscesses.  Eyes:     General: No scleral icterus.       Right eye: No discharge.        Left eye: No discharge.     Extraocular Movements: Extraocular movements intact.     Right eye: Normal extraocular motion.     Left eye: Normal extraocular motion.     Conjunctiva/sclera: Conjunctivae normal.  Cardiovascular:     Rate and Rhythm: Normal rate and regular rhythm.     Heart sounds: Normal heart sounds. No murmur heard.    No friction rub. No gallop.  Pulmonary:     Effort: Pulmonary effort is normal. No respiratory distress.     Breath sounds: No stridor. No wheezing, rhonchi or rales.  Chest:     Chest wall: No tenderness.  Abdominal:     General: Bowel sounds are normal. There is no distension.     Palpations: Abdomen is soft. There is no mass.     Tenderness: There is no abdominal tenderness. There is no right CVA tenderness, left CVA tenderness, guarding or rebound.  Musculoskeletal:     Cervical back: Normal range of motion and neck supple.  Lymphadenopathy:     Cervical: No cervical adenopathy.  Skin:    General: Skin is warm and dry.  Neurological:     General: No focal deficit present.     Mental Status: She is alert and oriented to person, place, and time.  Psychiatric:        Mood and Affect: Mood normal.        Behavior: Behavior normal.        Thought Content: Thought content normal.        Judgment: Judgment normal.     Results for orders placed or performed during the hospital encounter of 02/29/24 (from the past 24 hours)  POC Covid19/Flu A&B Antigen     Status: Normal   Collection Time: 02/29/24  2:58 PM  Result Value Ref Range   Influenza A  Antigen, POC Negative Negative   Influenza B Antigen, POC Negative Negative   Covid Antigen, POC Negative Negative    Assessment and Plan :   PDMP not reviewed this encounter.  1. Acute viral syndrome   2. Acute cough   3. Screen for STD (sexually transmitted disease)   4. Smoker    STI check pending. Patient requested empiric  treatment for BV. Will use metronidazole . Recommended prednisone and albuterol for her significant respiratory symptoms in the context of her daily smoking. Otherwise, supportive care for a viral URI. Counseled patient on potential for adverse effects with medications prescribed/recommended today, ER and return-to-clinic precautions discussed, patient verbalized understanding.     Christopher Savannah, PA-C 02/29/24 1600

## 2024-02-29 NOTE — Discharge Instructions (Addendum)
 We will manage this as a viral syndrome, viral respiratory infection. For sore throat or cough try using a honey-based tea. Use 3 teaspoons of honey with juice squeezed from half lemon. Place shaved pieces of ginger into 1/2-1 cup of water and warm over stove top. Then mix the ingredients and repeat every 4 hours as needed. Please take Tylenol  500mg -650mg  once every 6 hours for fevers, aches and pains. Hydrate very well with at least 2 liters (64 ounces) of water. Eat light meals such as soups (chicken and noodles, chicken wild rice, vegetable).  Do not eat any foods that you are allergic to.  Start an antihistamine like Zyrtec  (10mg  daily) for postnasal drainage, sinus congestion.  You can take this together with prednisone and albuterol to help with your breathing. Hold off on smoking as much as possible or quit all together.   Start metronidazole  for suspected BV infection. We will update your test results tomorrow.

## 2024-02-29 NOTE — ED Triage Notes (Addendum)
 Patient c/o coughing with clear phlegm. Pt also c/o chest congestion, headache, nausea and diarrhea.  Pt also stated that she had a temperature of 101 yesterday. Pt stated that symptoms started 3 days ago and  that she took Nyquil pills and Day quill syrup. Last taken at 3 AM on 02/29/24. Pt is requesting a STD test.

## 2024-03-01 LAB — CERVICOVAGINAL ANCILLARY ONLY
Bacterial Vaginitis (gardnerella): NEGATIVE
Candida Glabrata: NEGATIVE
Candida Vaginitis: NEGATIVE
Chlamydia: NEGATIVE
Comment: NEGATIVE
Comment: NEGATIVE
Comment: NEGATIVE
Comment: NEGATIVE
Comment: NEGATIVE
Comment: NORMAL
Neisseria Gonorrhea: NEGATIVE
Trichomonas: NEGATIVE

## 2024-03-01 LAB — HIV ANTIBODY (ROUTINE TESTING W REFLEX): HIV Screen 4th Generation wRfx: NONREACTIVE

## 2024-03-01 LAB — RPR: RPR Ser Ql: NONREACTIVE

## 2024-03-03 LAB — MISC LABCORP TEST (SEND OUT): Labcorp test code: 180076

## 2024-03-12 NOTE — ED Notes (Signed)
 Pt did not answer phone. I ccalled 3x

## 2024-03-13 ENCOUNTER — Other Ambulatory Visit: Payer: Self-pay

## 2024-03-13 ENCOUNTER — Ambulatory Visit
Admission: RE | Admit: 2024-03-13 | Discharge: 2024-03-13 | Disposition: A | Discharge status: Home / Self Care | Attending: Emergency Medicine | Admitting: Emergency Medicine

## 2024-03-13 VITALS — BP 110/78 | HR 87 | Temp 98.8°F | Resp 15 | Ht 66.0 in | Wt 130.0 lb

## 2024-03-13 DIAGNOSIS — J069 Acute upper respiratory infection, unspecified: Secondary | ICD-10-CM

## 2024-03-13 DIAGNOSIS — J3489 Other specified disorders of nose and nasal sinuses: Secondary | ICD-10-CM

## 2024-03-13 LAB — POC COVID19/FLU A&B COMBO
Covid Antigen, POC: NEGATIVE
Influenza A Antigen, POC: NEGATIVE
Influenza B Antigen, POC: NEGATIVE

## 2024-03-13 MED ORDER — PREDNISONE 20 MG PO TABS
40.0000 mg | ORAL_TABLET | Freq: Every day | ORAL | 0 refills | Status: AC
Start: 2024-03-13 — End: 2024-03-18

## 2024-03-13 MED ORDER — PROMETHAZINE-DM 6.25-15 MG/5ML PO SYRP: 5.0000 mL | ORAL_SOLUTION | Freq: Four times a day (QID) | ORAL | 0 refills | Status: AC | PRN

## 2024-03-13 MED ORDER — IBUPROFEN 800 MG PO TABS: 800.0000 mg | ORAL_TABLET | Freq: Three times a day (TID) | ORAL | 0 refills | Status: AC

## 2024-03-13 NOTE — Discharge Instructions (Addendum)
 COVID and flu testing were negative.  Symptoms are consistent with viral URI, these typically last 5 to 7 days in duration.  Start the prednisone  tomorrow with breakfast to help with your sinus pain and pressure.  Use the cough syrup as needed, do not drink alcohol or drive on this medication as it can cause drowsiness.  Please alternate between 800 mg of ibuprofen  and 500 mg of Tylenol  every 4-6 hours to help with pain.   Viral illnesses typically last 5 to 7 days in duration.  For any prolonged symptoms or new concerning symptoms follow-up with your primary care provider or return to clinic for reevaluation.

## 2024-03-13 NOTE — ED Triage Notes (Signed)
 Pt presents with a chief complaint of bilateral ear fullness x 2-3 days. States she has been experiencing constant headaches/head congestion. Has been using ice packs + taking Ibuprofen  with no improvement/relief. No fevers for the last 3 to 4 days. Currently rates overall pain a 7/10. Pain/pressure is worse on the left side of face/ear.

## 2024-03-13 NOTE — ED Provider Notes (Signed)
 [facility]    CSN: [id number] Arrival date & time: 03/13/24  1647      History   Chief Complaint Chief Complaint  Patient presents with   Ear Fullness    Entered by patient    HPI Natasha Douglas is a 23 y.o. female.   Patient presents to clinic over concern of bilateral ear fullness, sore throat, nasal congestion, rhinorrhea, sinus pressure, and cough for the past 2 or 3 days.  Has a lot of sinus pressure, feels like her left ear pain is worse than the right.    Without wheezing or shortness of breath.  No history of asthma.  Denies fevers.  Denies recent sick contacts.  The history is provided by the patient and medical records.  Ear Fullness    Past Medical History:  Diagnosis Date   Allergy     Anemia    abstracted from outside medical records.    Angio-edema    Concussion 2012   Displacement of intervertebral disc, site unspecified, without myelopathy    age 34 - abstracted from oustide medical record.     Eczema    Herpes gingivostomatitis    age 26 (abstracted from outside medical records)   Proteinuria 2012   abstracted from outside medical record - random urine protein 19 mg/dL / Random cr 833.0  = normal PC ratio   Urticaria     Patient Active Problem List   Diagnosis Date Noted   Ingrown toenail 03/27/2019   Encounter for other contraceptive management 04/04/2018   Failed hearing screening 09/26/2017   Allergic reaction 07/20/2016   Allergic conjunctivitis 07/20/2016   Atopic dermatitis 07/20/2016   Food allergy  06/01/2016   Left knee injury 10/01/2014   Failed vision screen 10/01/2014   Perennial and seasonal allergic rhinitis 01/31/2013    Past Surgical History:  Procedure Laterality Date   NO PAST SURGERIES      OB History     Gravida  0   Para  0   Term  0   Preterm  0   AB  0   Living         SAB  0   IAB  0   Ectopic  0   Multiple      Live Births               Home Medications    Prior  to Admission medications   Medication Sig Start Date End Date Taking? Authorizing Provider  ibuprofen  (ADVIL ) 800 MG tablet Take 1 tablet (800 mg total) by mouth 3 (three) times daily. 03/13/24  Yes Laster Appling  N, FNP  albuterol  (VENTOLIN  HFA) 108 (90 Base) MCG/ACT inhaler Inhale 1-2 puffs into the lungs every 6 (six) hours as needed for wheezing or shortness of breath. 02/29/24   Christopher Savannah, PA-C  BENZACLIN  gel Apply topically every morning. 05/10/19   Joshua Bari CHRISTELLA, NP  drospirenone-ethinyl estradiol (YAZ) 3-0.02 MG tablet Take 1 tablet by mouth daily.    [provider]  EPINEPHrine  0.3 mg/0.3 mL IJ SOAJ injection Inject 0.3 mLs (0.3 mg total) into the muscle as needed for anaphylaxis. 12/23/18   Lucila Delon BROCKS, NP  loperamide  (IMODIUM ) 2 MG capsule Take 1 capsule (2 mg total) by mouth 2 (two) times daily as needed for diarrhea or loose stools. 06/23/23   Christopher Savannah, PA-C  naproxen  (NAPROSYN ) 500 MG tablet Take 1 tablet (500 mg total) by mouth 2 (two) times daily with a meal. 09/10/23  Christopher Savannah, PA-C  ondansetron  (ZOFRAN -ODT) 8 MG disintegrating tablet Take 1 tablet (8 mg total) by mouth every 8 (eight) hours as needed for nausea or vomiting. 06/23/23   Christopher Savannah, PA-C  predniSONE  (DELTASONE ) 20 MG tablet Take 2 tablets (40 mg total) by mouth daily with breakfast for 5 days. 03/13/24 03/18/24 Yes Anum Palecek  N, FNP  promethazine -dextromethorphan (PROMETHAZINE -DM) 6.25-15 MG/5ML syrup Take 5 mLs by mouth 4 (four) times daily as needed for cough. 03/13/24  Yes Briannon Boggio  N, FNP  tretinoin  (RETIN-A ) 0.025 % cream Apply topically at bedtime. 05/14/19   Viviana Aleck DASEN, FNP  cetirizine  (ZYRTEC ) 10 MG tablet Take 1 tablet (10 mg total) by mouth daily as needed for allergies. Patient not taking: Reported on 04/04/2018 08/17/17 11/16/18  Ronette Neighbor, MD  fluticasone  (FLONASE ) 50 MCG/ACT nasal spray Place 2 sprays into both nostrils daily. Patient not taking:  Reported on 04/04/2018 08/17/17 11/16/18  Ronette Neighbor, MD    Family History Family History  Problem Relation Age of Onset   Allergic rhinitis Mother     Social History Social History   Tobacco Use   Smoking status: Never   Smokeless tobacco: Never  Vaping Use   Vaping status: Never Used  Substance Use Topics   Alcohol use: Yes    Comment: Occa   Drug use: Yes    Types: Marijuana     Allergies   Shellfish allergy  and Pollen extract   Review of Systems Review of Systems  Per HPI  Physical Exam Triage Vital Signs ED Triage Vitals  Encounter Vitals Group     BP 03/13/24 1653 110/78     Girls Systolic BP Percentile --      Girls Diastolic BP Percentile --      Boys Systolic BP Percentile --      Boys Diastolic BP Percentile --      Pulse Rate 03/13/24 1653 87     Resp 03/13/24 1653 15     Temp 03/13/24 1653 98.8 F (37.1 C)     Temp Source 03/13/24 1653 Oral     SpO2 03/13/24 1653 97 %     Weight 03/13/24 1653 130 lb (59 kg)     Height 03/13/24 1653 5' 6 (1.676 m)     Head Circumference --      Peak Flow --      Pain Score 03/13/24 1703 7     Pain Loc --      Pain Education --      Exclude from Growth Chart --    No data found.  Updated Vital Signs BP 110/78 (BP Location: Right Arm)   Pulse 87   Temp 98.8 F (37.1 C) (Oral)   Resp 15   Ht 5' 6 (1.676 m)   Wt 130 lb (59 kg)   LMP 02/20/2024 (Exact Date)   SpO2 97%   BMI 20.98 kg/m   Visual Acuity Right Eye Distance:   Left Eye Distance:   Bilateral Distance:    Right Eye Near:   Left Eye Near:    Bilateral Near:     Physical Exam Vitals and nursing note reviewed.  Constitutional:      Appearance: Normal appearance.  HENT:     Head: Normocephalic and atraumatic.     Right Ear: Tympanic membrane, ear canal and external ear normal.     Left Ear: Tympanic membrane, ear canal and external ear normal.     Nose: Congestion present.  Mouth/Throat:     Mouth: Mucous membranes are  moist.     Pharynx: Posterior oropharyngeal erythema present.  Eyes:     Conjunctiva/sclera: Conjunctivae normal.  Cardiovascular:     Rate and Rhythm: Normal rate and regular rhythm.     Heart sounds: Normal heart sounds. No murmur heard. Pulmonary:     Effort: Pulmonary effort is normal. No respiratory distress.     Breath sounds: Normal breath sounds. No wheezing.  Lymphadenopathy:     Cervical: Cervical adenopathy present.  Skin:    General: Skin is warm and dry.  Neurological:     General: No focal deficit present.     Mental Status: She is alert and oriented to person, place, and time.  Psychiatric:        Mood and Affect: Mood normal.        Behavior: Behavior normal.      UC Treatments / Results  Labs (all labs ordered are listed, but only abnormal results are displayed) Labs Reviewed  POC COVID19/FLU A&B COMBO - Normal    EKG   Radiology No results found.  Procedures Procedures (including critical care time)  Medications Ordered in UC Medications - No data to display  Initial Impression / Assessment and Plan / UC Course  I have reviewed the triage vital signs and the nursing notes.  Pertinent labs & imaging results that were available during my care of the patient were reviewed by me and considered in my medical decision making (see chart for details).  Vitals and triage reviewed, patient is hemodynamically stable.  Lungs vesicular, heart with regular rate and rhythm.  Congestion, rhinorrhea and postnasal drip present.  Bilateral tympanic membranes are pearly gray with good cone of light.  No evidence for otitis media.  Cervical LAD.  Symptoms consistent with viral URI.  COVID and flu testing negative.  Symptomatic management discussed.  Plan of care, follow-up care return precautions given, no questions at this time.    Final Clinical Impressions(s) / UC Diagnoses   Final diagnoses:  Viral URI with cough     Discharge Instructions       COVID and flu testing were negative.  Symptoms are consistent with viral URI, these typically last 5 to 7 days in duration.  Start the prednisone  tomorrow with breakfast to help with your sinus pain and pressure.  Use the cough syrup as needed, do not drink alcohol or drive on this medication as it can cause drowsiness.  Please alternate between 800 mg of ibuprofen  and 500 mg of Tylenol  every 4-6 hours to help with pain.   Viral illnesses typically last 5 to 7 days in duration.  For any prolonged symptoms or new concerning symptoms follow-up with your primary care provider or return to clinic for reevaluation.      ED Prescriptions     Medication Sig Dispense Auth. Provider   predniSONE  (DELTASONE ) 20 MG tablet Take 2 tablets (40 mg total) by mouth daily with breakfast for 5 days. 10 tablet Dreama, Kiandra Sanguinetti  N, FNP   promethazine -dextromethorphan (PROMETHAZINE -DM) 6.25-15 MG/5ML syrup Take 5 mLs by mouth 4 (four) times daily as needed for cough. 118 mL Dreama, Welles Walthall  N, FNP   ibuprofen  (ADVIL ) 800 MG tablet Take 1 tablet (800 mg total) by mouth 3 (three) times daily. 30 tablet Dreama Gerardo SAILOR, FNP      PDMP not reviewed this encounter.   Dreama, Lucan Riner  N, FNP 03/13/24 1739
